# Patient Record
Sex: Male | Born: 1955 | Race: Black or African American | Hispanic: No | Marital: Married | Smoking: Former smoker
Health system: Southern US, Community
[De-identification: ages and names within clinical notes are randomized; demographics above are authoritative.]

## PROBLEM LIST (undated history)

## (undated) DIAGNOSIS — E119 Type 2 diabetes mellitus without complications: Secondary | ICD-10-CM

## (undated) DIAGNOSIS — I10 Essential (primary) hypertension: Secondary | ICD-10-CM

## (undated) DIAGNOSIS — M1990 Unspecified osteoarthritis, unspecified site: Secondary | ICD-10-CM

## (undated) DIAGNOSIS — E785 Hyperlipidemia, unspecified: Secondary | ICD-10-CM

## (undated) DIAGNOSIS — T8859XA Other complications of anesthesia, initial encounter: Secondary | ICD-10-CM

## (undated) DIAGNOSIS — T4145XA Adverse effect of unspecified anesthetic, initial encounter: Secondary | ICD-10-CM

## (undated) DIAGNOSIS — G629 Polyneuropathy, unspecified: Secondary | ICD-10-CM

## (undated) DIAGNOSIS — J45909 Unspecified asthma, uncomplicated: Secondary | ICD-10-CM

## (undated) DIAGNOSIS — M5126 Other intervertebral disc displacement, lumbar region: Secondary | ICD-10-CM

## (undated) DIAGNOSIS — M5020 Other cervical disc displacement, unspecified cervical region: Secondary | ICD-10-CM

## (undated) DIAGNOSIS — M359 Systemic involvement of connective tissue, unspecified: Secondary | ICD-10-CM

## (undated) HISTORY — PX: COLONOSCOPY: SHX174

## (undated) HISTORY — PX: EPIDURAL BLOCK INJECTION: SHX1516

## (undated) HISTORY — DX: Hyperlipidemia, unspecified: E78.5

## (undated) HISTORY — DX: Unspecified osteoarthritis, unspecified site: M19.90

## (undated) HISTORY — DX: Type 2 diabetes mellitus without complications: E11.9

## (undated) HISTORY — PX: CYST EXCISION: SHX5701

## (undated) HISTORY — DX: Polyneuropathy, unspecified: G62.9

---
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
## 2017-09-23 HISTORY — PX: COLONOSCOPY: SHX174

## 2018-05-06 ENCOUNTER — Encounter: Payer: Self-pay | Admitting: Radiology

## 2018-05-06 ENCOUNTER — Emergency Department: Payer: Federal, State, Local not specified - PPO

## 2018-05-06 ENCOUNTER — Emergency Department
Admission: EM | Admit: 2018-05-06 | Discharge: 2018-05-06 | Disposition: A | Discharge status: Home / Self Care | Payer: Federal, State, Local not specified - PPO | Attending: Emergency Medicine | Admitting: Emergency Medicine

## 2018-05-06 ENCOUNTER — Other Ambulatory Visit: Payer: Self-pay

## 2018-05-06 DIAGNOSIS — I10 Essential (primary) hypertension: Secondary | ICD-10-CM | POA: Insufficient documentation

## 2018-05-06 DIAGNOSIS — J45909 Unspecified asthma, uncomplicated: Secondary | ICD-10-CM | POA: Insufficient documentation

## 2018-05-06 DIAGNOSIS — K1120 Sialoadenitis, unspecified: Secondary | ICD-10-CM | POA: Insufficient documentation

## 2018-05-06 DIAGNOSIS — R600 Localized edema: Secondary | ICD-10-CM | POA: Diagnosis present

## 2018-05-06 HISTORY — DX: Systemic involvement of connective tissue, unspecified: M35.9

## 2018-05-06 HISTORY — DX: Unspecified asthma, uncomplicated: J45.909

## 2018-05-06 HISTORY — DX: Essential (primary) hypertension: I10

## 2018-05-06 LAB — CBC WITH DIFFERENTIAL/PLATELET
Basophils Absolute: 0 10*3/uL (ref 0–0.1)
Basophils Relative: 1 %
EOS ABS: 0.2 10*3/uL (ref 0–0.7)
EOS PCT: 2 %
HCT: 38.4 % — ABNORMAL LOW (ref 40.0–52.0)
Hemoglobin: 13.3 g/dL (ref 13.0–18.0)
LYMPHS ABS: 1.5 10*3/uL (ref 1.0–3.6)
Lymphocytes Relative: 25 %
MCH: 32 pg (ref 26.0–34.0)
MCHC: 34.5 g/dL (ref 32.0–36.0)
MCV: 92.5 fL (ref 80.0–100.0)
MONOS PCT: 8 %
Monocytes Absolute: 0.5 10*3/uL (ref 0.2–1.0)
Neutro Abs: 4.1 10*3/uL (ref 1.4–6.5)
Neutrophils Relative %: 64 %
PLATELETS: 220 10*3/uL (ref 150–440)
RBC: 4.15 MIL/uL — ABNORMAL LOW (ref 4.40–5.90)
RDW: 13.9 % (ref 11.5–14.5)
WBC: 6.3 10*3/uL (ref 3.8–10.6)

## 2018-05-06 LAB — BASIC METABOLIC PANEL
Anion gap: 9 (ref 5–15)
BUN: 13 mg/dL (ref 8–23)
CALCIUM: 9.1 mg/dL (ref 8.9–10.3)
CHLORIDE: 109 mmol/L (ref 98–111)
CO2: 25 mmol/L (ref 22–32)
CREATININE: 1.08 mg/dL (ref 0.61–1.24)
GFR calc Af Amer: >60 mL/min (ref >60)
GFR calc non Af Amer: >60 mL/min (ref >60)
Glucose, Bld: 124 mg/dL — ABNORMAL HIGH (ref 70–99)
Potassium: 3.7 mmol/L (ref 3.5–5.1)
SODIUM: 143 mmol/L (ref 135–145)

## 2018-05-06 MED ORDER — DEXAMETHASONE 4 MG PO TABS
12.0000 mg | ORAL_TABLET | Freq: Once | ORAL | Status: AC
  Administered 2018-05-06: 12 mg via ORAL
  Filled 2018-05-06 (×2): qty 3

## 2018-05-06 MED ORDER — IBUPROFEN 600 MG PO TABS: 600.0000 mg | ORAL_TABLET | Freq: Four times a day (QID) | ORAL | 0 refills | Status: DC | PRN

## 2018-05-06 MED ORDER — AMOXICILLIN-POT CLAVULANATE 875-125 MG PO TABS: 1.0000 | ORAL_TABLET | Freq: Two times a day (BID) | ORAL | 0 refills | Status: AC

## 2018-05-06 MED ORDER — AMOXICILLIN-POT CLAVULANATE 875-125 MG PO TABS
1.0000 | ORAL_TABLET | Freq: Once | ORAL | Status: AC
  Administered 2018-05-06: 1 via ORAL
  Filled 2018-05-06: qty 1

## 2018-05-06 MED ORDER — IOHEXOL 300 MG/ML  SOLN
75.0000 mL | Freq: Once | INTRAMUSCULAR | Status: AC | PRN
  Administered 2018-05-06: 75 mL via INTRAVENOUS

## 2018-05-06 NOTE — ED Triage Notes (Signed)
Pt in with co left sided neck pain and swelling radiates to left jaw and face. States has hx of salivary glad stones and states feels the same.

## 2018-05-06 NOTE — Discharge Instructions (Addendum)
Take the antibiotic as prescribed and finish the full course.  You may take ibuprofen as needed for pain.  You should take sour candies frequently throughout the day to help increase your production of saliva.  Make an appointment to follow-up with the ENT as soon as possible.  Return to the ER for new, worsening, persistent severe pain, swelling, fever, difficulty swallowing, shortness of breath, or any other new or worsening symptoms that concern you.

## 2018-05-06 NOTE — ED Provider Notes (Signed)
[facility] Emergency Department Provider Note ____________________________________________   First MD Initiated Contact with Patient 05/06/18 0209     (approximate)  I have reviewed the triage vital signs and the nursing notes.   HISTORY  Chief Complaint Facial Swelling    HPI Alejandro Mendoza is a 62 y.o. male with prior history of sialolithiasis who presents with left neck swelling over the last several days, gradual onset, persistent course, and associated with discomfort when swallowing.  Patient denies shortness of breath.  He states that it feels somewhat similar to when he has had salivary gland stones before, but he reports that those usually cause more discomfort in his tongue.  He denies fever or chills, and has no headache.  Past Medical History:  Diagnosis Date  . Asthma   . Collagen vascular disease (HCC)   . Hypertension     There are no active problems to display for this patient.     Prior to Admission medications   Medication Sig Start Date End Date Taking? Authorizing Provider  amoxicillin-clavulanate (AUGMENTIN) 875-125 MG tablet Take 1 tablet by mouth 2 (two) times daily for 7 days. 05/06/18 05/13/18  Dionne BucySiadecki, Azie Mcconahy, MD  ibuprofen (ADVIL,MOTRIN) 600 MG tablet Take 1 tablet (600 mg total) by mouth every 6 (six) hours as needed. 05/06/18   Dionne BucySiadecki, Manar Smalling, MD    Allergies Lisinopril  No family history on file.  Social History Social History   Tobacco Use  . Smoking status: Not on file  Substance Use Topics  . Alcohol use: Not on file  . Drug use: Not on file    Review of Systems  Constitutional: No fever/chills. Eyes: No visual changes. ENT: Positive for neck swelling. Cardiovascular: Denies chest pain. Respiratory: Denies shortness of breath. Gastrointestinal: No vomiting.  Genitourinary: Negative for dysuria.  Musculoskeletal: Negative for back pain. Skin: Negative for rash. Neurological: Negative for  headache.   ____________________________________________   PHYSICAL EXAM:  VITAL SIGNS: ED Triage Vitals  Enc Vitals Group     BP 05/06/18 0147 133/61     Pulse Rate 05/06/18 0147 67     Resp 05/06/18 0147 20     Temp 05/06/18 0147 98.6 F (37 C)     Temp Source 05/06/18 0147 Oral     SpO2 05/06/18 0147 99 %     Weight 05/06/18 0148 265 lb (120.2 kg)     Height 05/06/18 0148 6\' 3"  (1.905 m)     Head Circumference --      Peak Flow --      Pain Score 05/06/18 0148 1     Pain Loc --      Pain Edu? --      Excl. in GC? --     Constitutional: Alert and oriented. Well appearing and in no acute distress. Eyes: Conjunctivae are normal.  Head: Atraumatic. Nose: No congestion/rhinnorhea. Mouth/Throat: Mucous membranes are moist.  Oropharynx clear.  No stridor or pooled secretions. Neck: Left lateral neck and submandibular swelling.  No erythema, induration, or fluctuance. Cardiovascular: Normal rate, regular rhythm. Grossly normal heart sounds.  Good peripheral circulation. Respiratory: Normal respiratory effort.  No retractions. Lungs CTAB. Gastrointestinal: No distention.  Musculoskeletal:  Extremities warm and well perfused.  Neurologic:  Normal speech and language. No gross focal neurologic deficits are appreciated.  Skin:  Skin is warm and dry. No rash noted. Psychiatric: Mood and affect are normal. Speech and behavior are normal.  ____________________________________________   LABS (all labs ordered are listed,  but only abnormal results are displayed)  Labs Reviewed  BASIC METABOLIC PANEL - Abnormal; Notable for the following components:      Result Value   Glucose, Bld 124 (*)    All other components within normal limits  CBC WITH DIFFERENTIAL/PLATELET - Abnormal; Notable for the following components:   RBC 4.15 (*)    HCT 38.4 (*)    All other components within normal limits    ____________________________________________  EKG   ____________________________________________  RADIOLOGY  CT neck soft tissue: Acute left submandibular sialoadenitis ____________________________________________   PROCEDURES  Procedure(s) performed: No  Procedures  Critical Care performed: No ____________________________________________   INITIAL IMPRESSION / ASSESSMENT AND PLAN / ED COURSE  Pertinent labs & imaging results that were available during my care of the patient were reviewed by me and considered in my medical decision making (see chart for details).  62 year old male with prior history of sialolithiasis presents with left neck swelling and pain over the last several days, with no fever.  On exam, the patient is relatively well-appearing, the vital signs are normal, and his oropharynx is clear.  There is no evidence of airway involvement.  There is swelling to the left side of the neck and in the submandibular region.  No erythema or cutaneous findings.  Differential includes sialolithiasis, sialoadenitis, lymphadenopathy, cyst, or facial cellulitis.  We will obtain basic labs, CT neck soft tissue, and reassess.  ----------------------------------------- 5:33 AM on 05/06/2018 -----------------------------------------  CT confirms acute left submandibular sialoadenitis with no abscess or other acute complications.  The lab work-up is reassuring.  The patient is stable for discharge home.  We will give a dose of Decadron here as well as the first dose of Augmentin.  The patient will be prescribed Augmentin and ibuprofen for home.  I also provided an ENT referral.  The patient agrees with the discharge plan.  Return precautions given, and he expresses understanding.  ____________________________________________   FINAL CLINICAL IMPRESSION(S) / ED DIAGNOSES  Final diagnoses:  Sialadenitis      NEW MEDICATIONS STARTED DURING THIS VISIT:  New  Prescriptions   AMOXICILLIN-CLAVULANATE (AUGMENTIN) 875-125 MG TABLET    Take 1 tablet by mouth 2 (two) times daily for 7 days.   IBUPROFEN (ADVIL,MOTRIN) 600 MG TABLET    Take 1 tablet (600 mg total) by mouth every 6 (six) hours as needed.     Note:  This document was prepared using Dragon voice recognition software and may include unintentional dictation errors.    Dionne BucySiadecki, Kristene Liberati, MD 05/06/18 [phone]

## 2018-06-03 ENCOUNTER — Other Ambulatory Visit: Payer: Self-pay

## 2018-06-03 ENCOUNTER — Encounter
Admission: RE | Admit: 2018-06-03 | Discharge: 2018-06-03 | Disposition: A | Discharge status: Home / Self Care | Payer: Federal, State, Local not specified - PPO | Source: Ambulatory Visit | Attending: Otolaryngology | Admitting: Otolaryngology

## 2018-06-03 HISTORY — DX: Other complications of anesthesia, initial encounter: T88.59XA

## 2018-06-03 HISTORY — DX: Other cervical disc displacement, unspecified cervical region: M50.20

## 2018-06-03 HISTORY — DX: Other intervertebral disc displacement, lumbar region: M51.26

## 2018-06-03 HISTORY — DX: Unspecified osteoarthritis, unspecified site: M19.90

## 2018-06-03 HISTORY — DX: Adverse effect of unspecified anesthetic, initial encounter: T41.45XA

## 2018-06-03 NOTE — Patient Instructions (Signed)
Your procedure is scheduled on: 06-10-18 [facility] Report to Same Day Surgery 2nd floor medical mall [facility] Entrance-take elevator on left to 2nd floor.  Check in with surgery information desk.) To find out your arrival time please call [phone] between 1PM - 3PM on 06-09-18 TUESDAY  Remember: Instructions that are not followed completely may result in serious medical risk, up to and including death, or upon the discretion of your surgeon and anesthesiologist your surgery may need to be rescheduled.    _x___ 1. Do not eat food after midnight the night before your procedure. NO GUM OR CANDY AFTER MIDNIGHT.  You may drink clear liquids up to 2 hours before you are scheduled to arrive at the hospital for your procedure.  Do not drink clear liquids within 2 hours of your scheduled arrival to the hospital.  Clear liquids include  --Water or Apple juice without pulp  --Clear carbohydrate beverage such as ClearFast or Gatorade  --Black Coffee or Clear Tea (No milk, no creamers, do not add anything to the coffee or Tea   ____Ensure clear carbohydrate drink on the way to the hospital for bariatric patients  ____Ensure clear carbohydrate drink 3 hours before surgery for Dr Rutherford Nail patients if physician instructed.     __x__ 2. No Alcohol for 24 hours before or after surgery.   __x__3. No Smoking or e-cigarettes for 24 prior to surgery.  Do not use any chewable tobacco products for at least 6 hour prior to surgery   ____  4. Bring all medications with you on the day of surgery if instructed.    __x__ 5. Notify your doctor if there is any change in your medical condition     (cold, fever, infections).    x___6. On the morning of surgery brush your teeth with toothpaste and water.  You may rinse your mouth with mouth wash if you wish.  Do not swallow any toothpaste or mouthwash.   Do not wear jewelry, make-up, hairpins, clips or nail polish.  Do not wear lotions, powders, or perfumes.  You may wear deodorant.  Do not shave 48 hours prior to surgery. Men may shave face and neck.  Do not bring valuables to the hospital.    [facility] is not responsible for any belongings or valuables.               Contacts, dentures or bridgework may not be worn into surgery.  Leave your suitcase in the car. After surgery it may be brought to your room.  For patients admitted to the hospital, discharge time is determined by your treatment team.  _  Patients discharged the day of surgery will not be allowed to drive home.  You will need someone to drive you home and stay with you the night of your procedure.    Please read over the following fact sheets that you were given:   [facility] Preparing for Surgery    _x___ TAKE THE FOLLOWING MEDICATION THE MORNING OF SURGERY WITH SMALL SIP OF WATER. These include:  1. LIPITOR  2. DILTIAZEM  3. GABAPENTIN  4.  5.  6.  ____Fleets enema or Magnesium Citrate as directed.   _x___ Use CHG Soap or sage wipes as directed on instruction sheet   _X___ Use inhalers on the day of surgery and bring to hospital day of surgery-BRING ALBUTEROL INHALER TO HOSPITAL DAY OF SURGERY  ____ Stop Metformin and Janumet 2 days prior to surgery.    ____  Take 1/2 of usual insulin dose the night before surgery and none on the morning surgery.   _x___ Follow recommendations from Cardiologist, Pulmonologist or PCP regarding stopping Aspirin, Coumadin, Plavix ,Eliquis, Effient, or Pradaxa, and Pletal-PT STOPPED ASPIRIN ON 05-31-18  X____Stop Anti-inflammatories such as Advil, Aleve, Ibuprofen, Motrin, Naproxen, Naprosyn, Goodies powders or aspirin products NOW- OK to take Tylenol OR HYDROCODONE IF NEEDED   _x___ Stop supplements until after surgery-PT STOPPED FISH OIL ON 05-31-18   ____ Bring C-Pap to the hospital.

## 2018-06-04 ENCOUNTER — Encounter
Admission: RE | Admit: 2018-06-04 | Discharge: 2018-06-04 | Disposition: A | Discharge status: Home / Self Care | Payer: Federal, State, Local not specified - PPO | Source: Ambulatory Visit | Attending: Otolaryngology | Admitting: Otolaryngology

## 2018-06-04 DIAGNOSIS — R001 Bradycardia, unspecified: Secondary | ICD-10-CM | POA: Insufficient documentation

## 2018-06-04 DIAGNOSIS — I10 Essential (primary) hypertension: Secondary | ICD-10-CM | POA: Insufficient documentation

## 2018-06-10 ENCOUNTER — Encounter: Payer: Self-pay | Admitting: *Deleted

## 2018-06-10 ENCOUNTER — Ambulatory Visit: Payer: Federal, State, Local not specified - PPO | Admitting: Anesthesiology

## 2018-06-10 ENCOUNTER — Ambulatory Visit
Admission: RE | Admit: 2018-06-10 | Discharge: 2018-06-10 | Disposition: A | Discharge status: Home / Self Care | Payer: Federal, State, Local not specified - PPO | Source: Ambulatory Visit | Attending: Otolaryngology | Admitting: Otolaryngology

## 2018-06-10 ENCOUNTER — Encounter
Admission: RE | Disposition: A | Discharge status: Home / Self Care | Payer: Self-pay | Source: Ambulatory Visit | Attending: Otolaryngology

## 2018-06-10 ENCOUNTER — Other Ambulatory Visit: Payer: Self-pay

## 2018-06-10 DIAGNOSIS — Z87891 Personal history of nicotine dependence: Secondary | ICD-10-CM | POA: Diagnosis not present

## 2018-06-10 DIAGNOSIS — I10 Essential (primary) hypertension: Secondary | ICD-10-CM | POA: Insufficient documentation

## 2018-06-10 DIAGNOSIS — Z7951 Long term (current) use of inhaled steroids: Secondary | ICD-10-CM | POA: Insufficient documentation

## 2018-06-10 DIAGNOSIS — K115 Sialolithiasis: Secondary | ICD-10-CM | POA: Insufficient documentation

## 2018-06-10 DIAGNOSIS — K219 Gastro-esophageal reflux disease without esophagitis: Secondary | ICD-10-CM | POA: Insufficient documentation

## 2018-06-10 DIAGNOSIS — K1123 Chronic sialoadenitis: Secondary | ICD-10-CM | POA: Diagnosis not present

## 2018-06-10 DIAGNOSIS — Z79899 Other long term (current) drug therapy: Secondary | ICD-10-CM | POA: Diagnosis not present

## 2018-06-10 DIAGNOSIS — K110 Atrophy of salivary gland: Secondary | ICD-10-CM | POA: Insufficient documentation

## 2018-06-10 DIAGNOSIS — J45909 Unspecified asthma, uncomplicated: Secondary | ICD-10-CM | POA: Diagnosis not present

## 2018-06-10 HISTORY — PX: CONTINUOUS NERVE MONITORING: SHX6650

## 2018-06-10 HISTORY — PX: SALIVARY STONE REMOVAL: SHX5213

## 2018-06-10 HISTORY — PX: SUBMANDIBULAR GLAND EXCISION: SHX2456

## 2018-06-10 SURGERY — EXCISION, SUBMANDIBULAR GLAND
Anesthesia: General | Site: Mouth

## 2018-06-10 MED ORDER — HYDROCODONE-ACETAMINOPHEN 5-325 MG PO TABS: 1.0000 | ORAL_TABLET | ORAL | 0 refills | Status: DC | PRN

## 2018-06-10 MED ORDER — BACITRACIN ZINC 500 UNIT/GM EX OINT
TOPICAL_OINTMENT | CUTANEOUS | Status: AC
  Filled 2018-06-10: qty 28.35

## 2018-06-10 MED ORDER — FENTANYL CITRATE (PF) 100 MCG/2ML IJ SOLN
INTRAMUSCULAR | Status: AC
  Filled 2018-06-10: qty 2

## 2018-06-10 MED ORDER — ONDANSETRON HCL 4 MG/2ML IJ SOLN
INTRAMUSCULAR | Status: DC | PRN
  Administered 2018-06-10: 4 mg via INTRAVENOUS

## 2018-06-10 MED ORDER — DEXAMETHASONE SODIUM PHOSPHATE 10 MG/ML IJ SOLN
INTRAMUSCULAR | Status: DC | PRN
  Administered 2018-06-10: 6 mg via INTRAVENOUS

## 2018-06-10 MED ORDER — ROCURONIUM 10MG/ML (10ML) SYRINGE FOR MEDFUSION PUMP - OPTIME
INTRAVENOUS | Status: DC | PRN
  Administered 2018-06-10: 20 mg via INTRAVENOUS

## 2018-06-10 MED ORDER — LACTATED RINGERS IV SOLN
INTRAVENOUS | Status: DC
  Administered 2018-06-10: 07:00:00 via INTRAVENOUS

## 2018-06-10 MED ORDER — SODIUM CHLORIDE 0.9 % IV SOLN
INTRAVENOUS | Status: DC | PRN
  Administered 2018-06-10: .1 ug/kg/min via INTRAVENOUS

## 2018-06-10 MED ORDER — LIDOCAINE-EPINEPHRINE (PF) 1 %-1:200000 IJ SOLN
INTRAMUSCULAR | Status: DC | PRN
  Administered 2018-06-10: 6 mL

## 2018-06-10 MED ORDER — PROPOFOL 10 MG/ML IV BOLUS
INTRAVENOUS | Status: DC | PRN
  Administered 2018-06-10: 160 mg via INTRAVENOUS
  Administered 2018-06-10: 40 mg via INTRAVENOUS

## 2018-06-10 MED ORDER — SUCCINYLCHOLINE CHLORIDE 20 MG/ML IJ SOLN
INTRAMUSCULAR | Status: DC | PRN
  Administered 2018-06-10: 140 mg via INTRAVENOUS

## 2018-06-10 MED ORDER — OXYCODONE HCL 5 MG/5ML PO SOLN: 5.0000 mg | Freq: Once | ORAL | Status: DC | PRN

## 2018-06-10 MED ORDER — EPHEDRINE SULFATE 50 MG/ML IJ SOLN
INTRAMUSCULAR | Status: DC | PRN
  Administered 2018-06-10 (×3): 10 mg via INTRAVENOUS

## 2018-06-10 MED ORDER — PHENYLEPHRINE HCL 10 MG/ML IJ SOLN
INTRAMUSCULAR | Status: DC | PRN
  Administered 2018-06-10: 200 ug via INTRAVENOUS

## 2018-06-10 MED ORDER — REMIFENTANIL HCL 1 MG IV SOLR
INTRAVENOUS | Status: AC
  Filled 2018-06-10: qty 1000

## 2018-06-10 MED ORDER — LIDOCAINE HCL (CARDIAC) PF 100 MG/5ML IV SOSY
PREFILLED_SYRINGE | INTRAVENOUS | Status: DC | PRN
  Administered 2018-06-10: 100 mg via INTRAVENOUS

## 2018-06-10 MED ORDER — PROPOFOL 10 MG/ML IV BOLUS
INTRAVENOUS | Status: AC
  Filled 2018-06-10: qty 20

## 2018-06-10 MED ORDER — AMOXICILLIN-POT CLAVULANATE 875-125 MG PO TABS: 1.0000 | ORAL_TABLET | Freq: Two times a day (BID) | ORAL | 0 refills | Status: DC

## 2018-06-10 MED ORDER — SUGAMMADEX SODIUM 200 MG/2ML IV SOLN
INTRAVENOUS | Status: DC | PRN
  Administered 2018-06-10: 200 mg via INTRAVENOUS

## 2018-06-10 MED ORDER — FAMOTIDINE 20 MG PO TABS
ORAL_TABLET | ORAL | Status: AC
  Administered 2018-06-10: 20 mg via ORAL
  Filled 2018-06-10: qty 1

## 2018-06-10 MED ORDER — MIDAZOLAM HCL 2 MG/2ML IJ SOLN
INTRAMUSCULAR | Status: AC
  Filled 2018-06-10: qty 2

## 2018-06-10 MED ORDER — LIDOCAINE-EPINEPHRINE (PF) 1 %-1:200000 IJ SOLN
INTRAMUSCULAR | Status: AC
  Filled 2018-06-10: qty 30

## 2018-06-10 MED ORDER — MIDAZOLAM HCL 2 MG/2ML IJ SOLN
INTRAMUSCULAR | Status: DC | PRN
  Administered 2018-06-10: 2 mg via INTRAVENOUS

## 2018-06-10 MED ORDER — FENTANYL CITRATE (PF) 100 MCG/2ML IJ SOLN
25.0000 ug | INTRAMUSCULAR | Status: DC | PRN
  Administered 2018-06-10 (×3): 25 ug via INTRAVENOUS

## 2018-06-10 MED ORDER — FENTANYL CITRATE (PF) 100 MCG/2ML IJ SOLN
INTRAMUSCULAR | Status: DC | PRN
  Administered 2018-06-10 (×2): 50 ug via INTRAVENOUS

## 2018-06-10 MED ORDER — VASOPRESSIN 20 UNIT/ML IV SOLN
INTRAVENOUS | Status: DC | PRN
  Administered 2018-06-10 (×2): 1 [IU] via INTRAVENOUS

## 2018-06-10 MED ORDER — SODIUM CHLORIDE 0.9 % IV SOLN
INTRAVENOUS | Status: DC | PRN
  Administered 2018-06-10: 25 ug/min via INTRAVENOUS

## 2018-06-10 MED ORDER — OXYCODONE HCL 5 MG PO TABS: 5.0000 mg | ORAL_TABLET | Freq: Once | ORAL | Status: DC | PRN

## 2018-06-10 MED ORDER — FAMOTIDINE 20 MG PO TABS
20.0000 mg | ORAL_TABLET | Freq: Once | ORAL | Status: AC
  Administered 2018-06-10: 20 mg via ORAL

## 2018-06-10 SURGICAL SUPPLY — 31 items
BLADE SURG 15 STRL LF DISP TIS (BLADE) ×3 IMPLANT
BLADE SURG 15 STRL SS (BLADE) ×2
CANISTER SUCT 1200ML W/VALVE (MISCELLANEOUS) ×5 IMPLANT
CORD BIP STRL DISP 12FT (MISCELLANEOUS) ×5 IMPLANT
DRAIN TLS ROUND 10FR (DRAIN) ×5 IMPLANT
DRAPE MAG INST 16X20 L/F (DRAPES) ×5 IMPLANT
DRSG TEGADERM 2-3/8X2-3/4 SM (GAUZE/BANDAGES/DRESSINGS) ×5 IMPLANT
ELECT EMG 20MM DUAL (MISCELLANEOUS) ×5
ELECT NEEDLE 20X.3 GREEN (MISCELLANEOUS) ×5
ELECT REM PT RETURN 9FT ADLT (ELECTROSURGICAL) ×5
ELECTRODE EMG 20MM DUAL (MISCELLANEOUS) ×3 IMPLANT
ELECTRODE NEEDLE 20X.3 GREEN (MISCELLANEOUS) ×3 IMPLANT
ELECTRODE REM PT RTRN 9FT ADLT (ELECTROSURGICAL) ×3 IMPLANT
FORCEPS JEWEL BIP 4-3/4 STR (INSTRUMENTS) ×5 IMPLANT
GLOVE BIO SURGEON STRL SZ7.5 (GLOVE) ×15 IMPLANT
GOWN STRL REUS W/ TWL LRG LVL3 (GOWN DISPOSABLE) ×9 IMPLANT
GOWN STRL REUS W/TWL LRG LVL3 (GOWN DISPOSABLE) ×6
HOOK STAY BLUNT/RETRACTOR 5M (MISCELLANEOUS) ×5 IMPLANT
KIT TURNOVER KIT A (KITS) ×5 IMPLANT
LABEL OR SOLS (LABEL) ×5 IMPLANT
NS IRRIG 500ML POUR BTL (IV SOLUTION) ×5 IMPLANT
PACK HEAD/NECK (MISCELLANEOUS) ×5 IMPLANT
PROBE MONO 100X0.75 ELECT 1.9M (MISCELLANEOUS) ×5 IMPLANT
SHEARS HARMONIC 9CM CVD (BLADE) ×5 IMPLANT
SPONGE KITTNER 5P (MISCELLANEOUS) ×5 IMPLANT
SPONGE XRAY 4X4 16PLY STRL (MISCELLANEOUS) ×5 IMPLANT
SUT PROLENE 5 0 PS 3 (SUTURE) ×5 IMPLANT
SUT SILK 2 0 (SUTURE) ×2
SUT SILK 2 0 PERMA HAND 18 BK (SUTURE) ×5 IMPLANT
SUT SILK 2-0 18XBRD TIE 12 (SUTURE) ×3 IMPLANT
SUT VIC AB 4-0 RB1 18 (SUTURE) ×5 IMPLANT

## 2018-06-10 NOTE — Transfer of Care (Signed)
Immediate Anesthesia Transfer of Care Note  Patient: Alejandro Mendoza  Procedure(s) Performed: EXCISION SUBMANDIBULAR GLAND (Left ) FACIAL NERVE MONITORING (N/A )  Patient Location: PACU  Anesthesia Type:General  Level of Consciousness: sedated  Airway & Oxygen Therapy: Patient Spontanous Breathing and Patient connected to face mask oxygen  Post-op Assessment: Report given to RN and Post -op Vital signs reviewed and stable  Post vital signs: Reviewed and stable  Last Vitals:  Vitals Value Taken Time  BP 116/66 06/10/2018 10:04 AM  Temp    Pulse 57 06/10/2018 10:06 AM  Resp    SpO2 98 % 06/10/2018 10:06 AM  Vitals shown include unvalidated device data.  Last Pain:  Vitals:   06/10/18 0618  TempSrc: Tympanic  PainSc: 4       Patients Stated Pain Goal: 0 (06/10/18 0618)  Complications: No apparent anesthesia complications

## 2018-06-10 NOTE — Anesthesia Procedure Notes (Signed)
Procedure Name: Intubation Date/Time: 06/10/2018 7:47 AM Performed by: Danelle BerryWarr, Karenna Romanoff E, CRNA Pre-anesthesia Checklist: Patient identified, Emergency Drugs available, Suction available, Patient being monitored and Timeout performed Patient Re-evaluated:Patient Re-evaluated prior to induction Oxygen Delivery Method: Simple face mask Preoxygenation: Pre-oxygenation with 100% oxygen Induction Type: IV induction Ventilation: Mask ventilation without difficulty Laryngoscope Size: McGraph and 4 Grade View: Grade II Tube size: 7.5 mm Number of attempts: 2 Airway Equipment and Method: Stylet Secured at: 22 cm Tube secured with: Tape Dental Injury: Teeth and Oropharynx as per pre-operative assessment

## 2018-06-10 NOTE — Op Note (Signed)
06/10/2018  9:39 AM    Alejandro Mendoza  [id number]   Pre-Op Diagnosis:  LEFT SUBMANDIBULAR SALIVARY GLAND STONES  Post-op Diagnosis: SAME  Procedure: EXCISION LEFT SUBMANDIBULAR GLAND WITH TRANSORAL REMOVAL OF SUBMANDIBULAR DUCT STONE  Surgeon:  Alejandro Mealy  Assistant: Linus Salmons, MD  Anesthesia:  General endotracheal anesthesia  EBL:  25cc  Complications:  None  Findings: Inflamed left submandibular gland with large stone in hilum of gland and dilated duct. Approximately 8mm stone in distal duct near the opening in the floor of the mouth was removed transorally  Procedure: The patient was taken to the Operating Room and placed in the supine position.  After induction of general endotracheal anesthesia, the patient was turned 90 degrees and placed on a shoulder roll. The skin was injected along the proposed incision line just inferior to the left submandibular gland 2 finger-breadths below the mandible.  1% lidocaine with epinephrine, 1:200,000. The facial nerve monitor electrodes were placed in the usual fashion at the  lower lip on the same side of the procedure. Proper functioning of the nerve monitor was assessed. The area was then prepped and draped in the usual sterile fashion.  A 15 blade was then used to incise the skin, and was carried down to the subcutaneous tissues with the Bovie, dividing the platysma muscle. Dissection then proceeded deeply, using the Harmonic scalpel to divid tissues, and monitoring carefully for any lip movement or nerve stimulation. The facial vein was divided and ties with 2-3 silk ligatures. The gland was then carefully dissected out, dividing soft tissue and fascial attachments to the capsule of the gland, which appeared inflamed. Branches from the facial artery into the gland were divided with the Harmonic Scalpel, preserving the artery. The lingual nerve was identified superiorly, and its ganglion branch divided to release it from the gland.  Dissection proceeded up under the mylohyoid muscle, following the gland and duct up towards the floor of the mouth.The more distal stone could not be located through the transcervical approach, though the proximal stone was palpable within the gland. The duct was divided and tied off with 2-0 silk, and the gland sent as specimen.   A drape was placed over the neck wound and the floor of mouth inspected, using a bite block to open the mouth. The distal stone was easily palpable and a 15 blade used to incise down onto the stone where it was close to the surface. Tenotomies were used to spread around the stone and gently work it out of the distal aspect of the duct, removing it. Instruments used for this were placed away from the sterile instrument and gloves were changed, and attention turned back to the neck.  The wound was irrigated with saline and hemostasis obtained. A #10 TLS drain was placed through a separate stab incision in the skin inferior to the incision in the neck, and secured with a 2-0 silk suture. Deep closure was performed with 4-0 Vicryl suture to close the platysma muscle. The subcutaneous tissues were then closed with 4-0 Vicryl suture in an interrupted fashion. The skin was closed with 5-0 Prolene suture in a running locked stitch. Bacitracin ointment was applied to the wound, and the vacuum tube applied to the TLS drain.  The patient was then returned to the anesthesiologist for awakening, and was taken to the Recovery Room in stable condition.  Cultures:  None.  Disposition:   PACU then discharge home  Plan: Leave drain in place until follow-up  tomorrow in office. Soft diet.   Alejandro Mendoza 06/10/2018 9:39 AM

## 2018-06-10 NOTE — OR Nursing (Signed)
Arrived to post op phase II c/o left jaw pain. States he feels something is in his throat and he feels he can't swallow. Visualized throat and found a bite block in the left cheek between the upper and lower teeth. Paged Dr Willeen CassBennett and instructed to remove. Upon removal, he had immediate relief. Mucous membrane intact. Anesthesia also notified.

## 2018-06-10 NOTE — Anesthesia Preprocedure Evaluation (Addendum)
Anesthesia Evaluation  Patient identified by MRN, date of birth, ID band Patient awake    Reviewed: Allergy & Precautions, H&P , NPO status , Patient's Chart, lab work & pertinent test results  History of Anesthesia Complications (+) history of anesthetic complications (heartburn after epidural)  Airway Mallampati: III  TM Distance: >3 FB Neck ROM: full   Comment: beard Dental no notable dental hx. (+) Teeth Intact   Pulmonary neg pulmonary ROS, asthma , former smoker,    breath sounds clear to auscultation       Cardiovascular hypertension, negative cardio ROS   Rhythm:regular Rate:Normal     Neuro/Psych negative neurological ROS  negative psych ROS   GI/Hepatic negative GI ROS, Neg liver ROS, GERD  Medicated and Controlled,  Endo/Other  negative endocrine ROS  Renal/GU      Musculoskeletal  (+) Arthritis ,   Abdominal   Peds  Hematology negative hematology ROS (+)   Anesthesia Other Findings Past Medical History: No date: Arthritis No date: Asthma     Comment:  ENVIRONMENTAL IN 2000-ALBUTEROL PRN No date: Collagen vascular disease (HCC) No date: Complication of anesthesia     Comment:  SEVERE HEARTBURN 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, 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 No date: Hypertension No date: Slipped disc in neck  Past Surgical History: No date: CYST EXCISION     Comment:  FROM INSIDE IN JAW No date: EPIDURAL BLOCK INJECTION  BMI    Body Mass Index:  33.07 kg/m      Reproductive/Obstetrics negative OB ROS                            Anesthesia Physical Anesthesia Plan  ASA: II  Anesthesia Plan: General ETT   Post-op Pain Management:    Induction:   PONV Risk Score and Plan: Ondansetron and Dexamethasone  Airway Management Planned:   Additional Equipment:   Intra-op Plan:   Post-operative Plan:   Informed Consent: I have reviewed the patients  History and Physical, chart, labs and discussed the procedure including the risks, benefits and alternatives for the proposed anesthesia with the patient or authorized representative who has indicated his/her understanding and acceptance.   Dental Advisory Given  Plan Discussed with: Anesthesiologist, CRNA and Surgeon  Anesthesia Plan Comments:         Anesthesia Quick Evaluation

## 2018-06-10 NOTE — H&P (Signed)
History and physical reviewed and will be scanned in later. No change in medical status reported by the patient or family, appears stable for surgery. All questions regarding the procedure answered, and patient (or family if a child) expressed understanding of the procedure. ? ?Alejandro Mendoza ?@TODAY@ ?

## 2018-06-10 NOTE — Anesthesia Post-op Follow-up Note (Signed)
Anesthesia QCDR form completed.        

## 2018-06-10 NOTE — Discharge Instructions (Signed)

## 2018-06-11 LAB — SURGICAL PATHOLOGY

## 2018-06-11 NOTE — Anesthesia Postprocedure Evaluation (Signed)
Anesthesia Post Note  Patient: Alejandro Mendoza  Procedure(s) Performed: EXCISION SUBMANDIBULAR GLAND (Left ) FACIAL NERVE MONITORING (N/A )  Patient location during evaluation: PACU Anesthesia Type: General Level of consciousness: awake and alert Pain management: pain level controlled Vital Signs Assessment: post-procedure vital signs reviewed and stable Respiratory status: spontaneous breathing, nonlabored ventilation and respiratory function stable Cardiovascular status: blood pressure returned to baseline and stable Postop Assessment: no apparent nausea or vomiting Anesthetic complications: no     Last Vitals:  Vitals:   06/10/18 1132 06/10/18 1229  BP: 127/78 (!) 144/72  Pulse: (!) 54 (!) 56  Resp: 16 16  Temp: (!) 36 C   SpO2: 97% 98%    Last Pain:  Vitals:   06/10/18 1229  TempSrc:   PainSc: 0-No pain                 Jovita GammaKathryn L Fitzgerald  Addendum: pt noted to have a black bite guard in his mouth that was causing him discomfort in Phase II.  This is an object that was inserted in the mouth by surgery team and anesthesia was not aware of its presence.  Incident report was filed.

## 2018-06-12 ENCOUNTER — Encounter: Payer: Self-pay | Admitting: Otolaryngology

## 2019-01-26 IMAGING — CT CT NECK W/ CM
4 of 5 series · 15 of 33 positions shown, 17 images · IV contrast (omnipaque)
Comparison: None.

CLINICAL DATA: LEFT neck pain and swelling radiating to face.
History of sialolith with similar symptoms.

EXAM:
CT NECK WITH CONTRAST
TECHNIQUE: Multidetector CT imaging of the neck was performed using the
standard protocol following the bolus administration of intravenous
contrast.
CONTRAST:  75mL OMNIPAQUE IOHEXOL 300 MG/ML  SOLN

[Series 2: axial neck · axial · 0.50mm/px · z∈[-263,-109]mm · 4 of 129 slices shown, 5 images]
[im 26/129  soft-tissue]
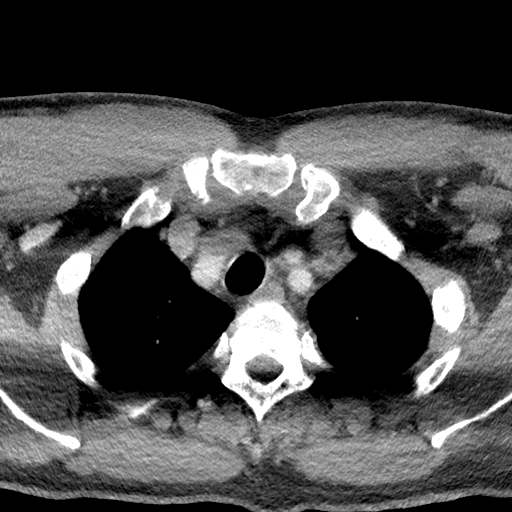
[im 26/129  bone]
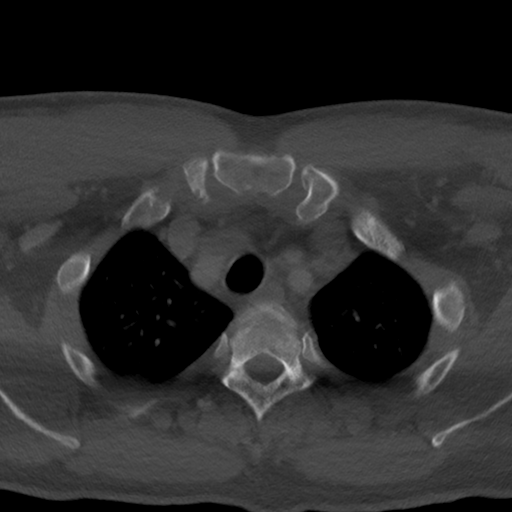
[im 52/129  bone]
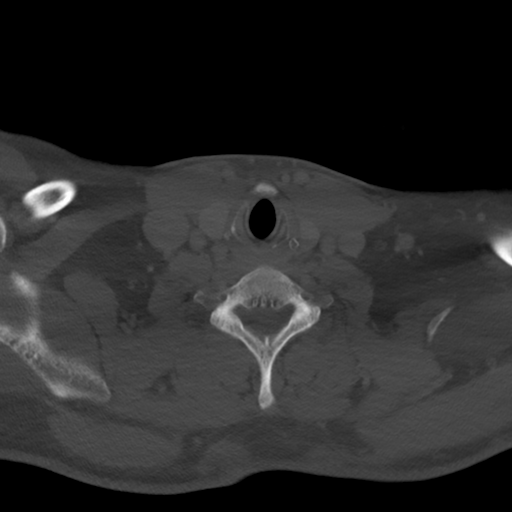
[im 77/129  bone]
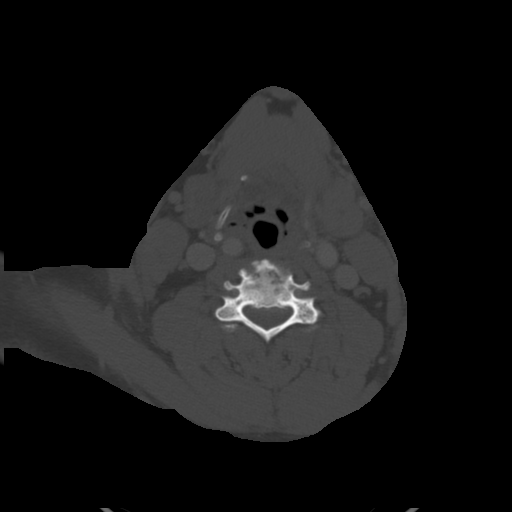
[im 103/129  bone]
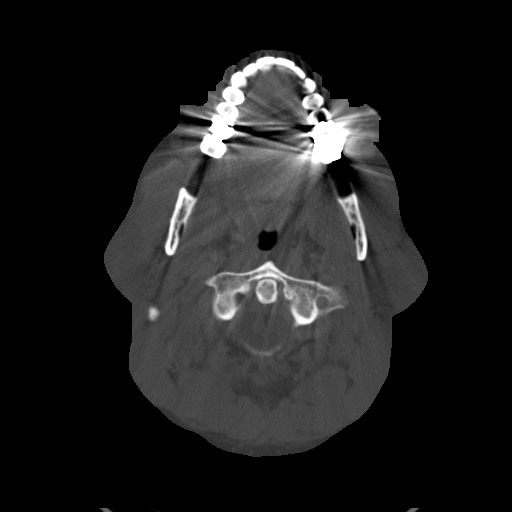

[Series 6: sag neck · sagittal · 0.51mm/px · 5 of 100 slices shown, 6 images]
[im 34/100  bone]
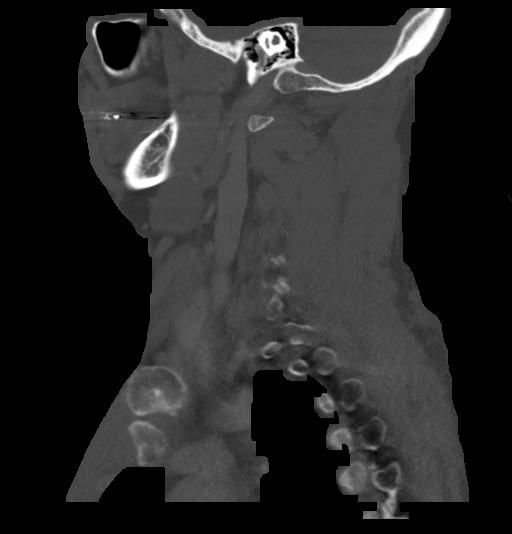
[im 42/100  bone]
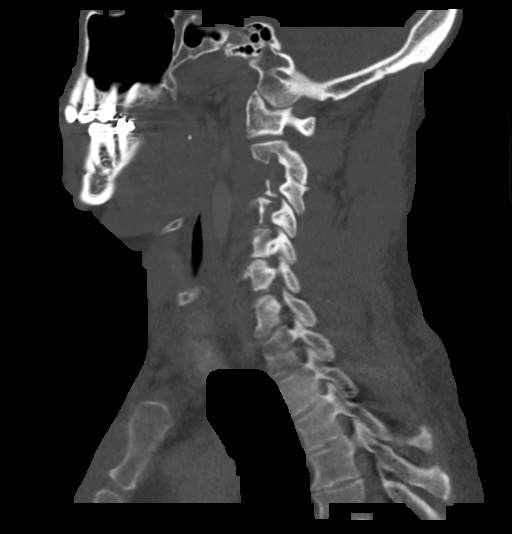
[im 50/100  soft-tissue]
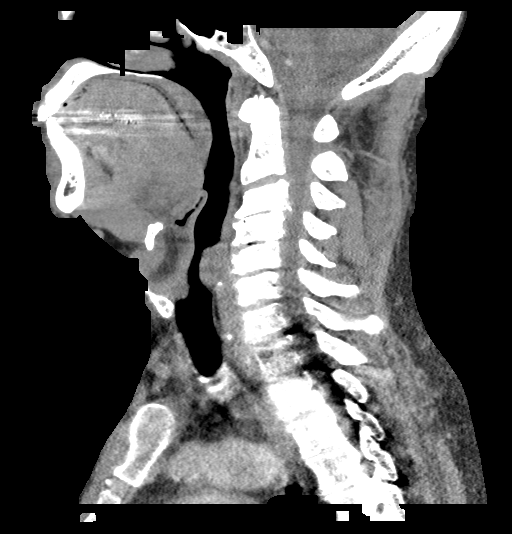
[im 50/100  bone]
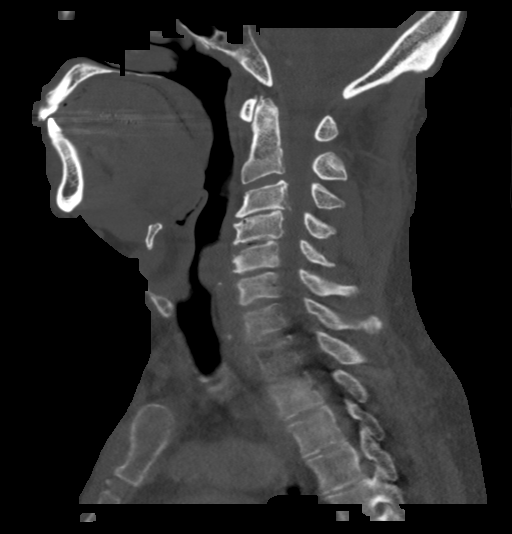
[im 58/100  bone]
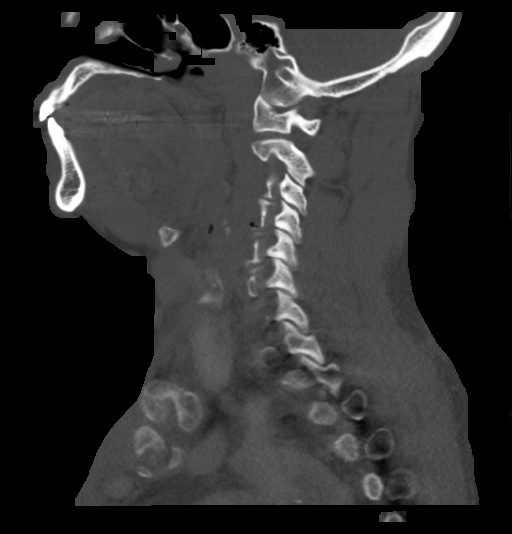
[im 67/100  bone]
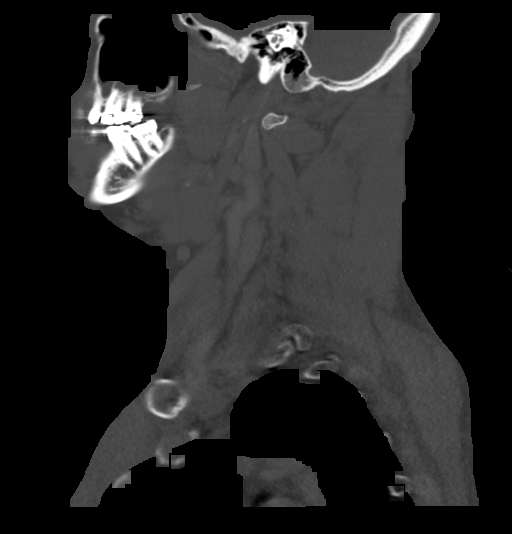

[Series 7: cor neck · coronal · 0.42mm/px · 3 of 117 slices shown]
[im 24/117  bone]
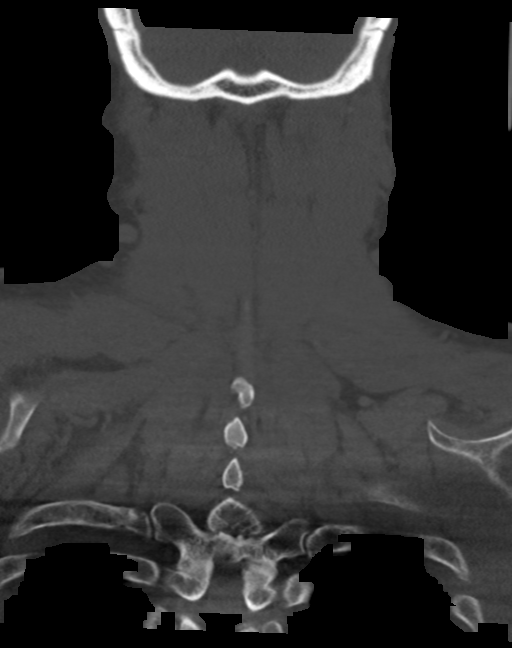
[im 47/117  bone]
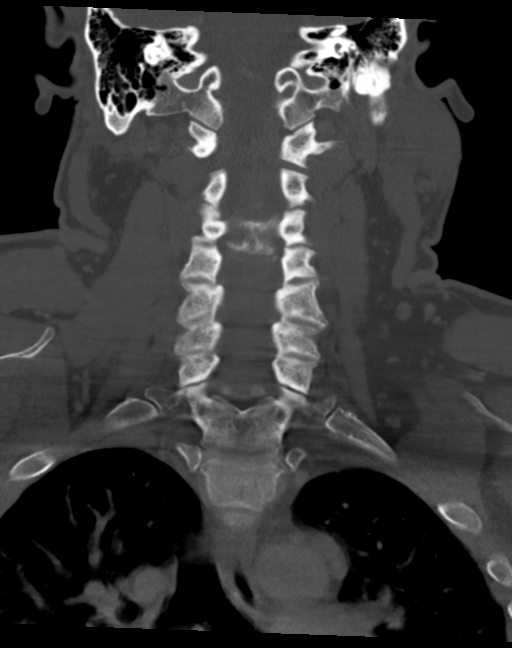
[im 70/117  bone]
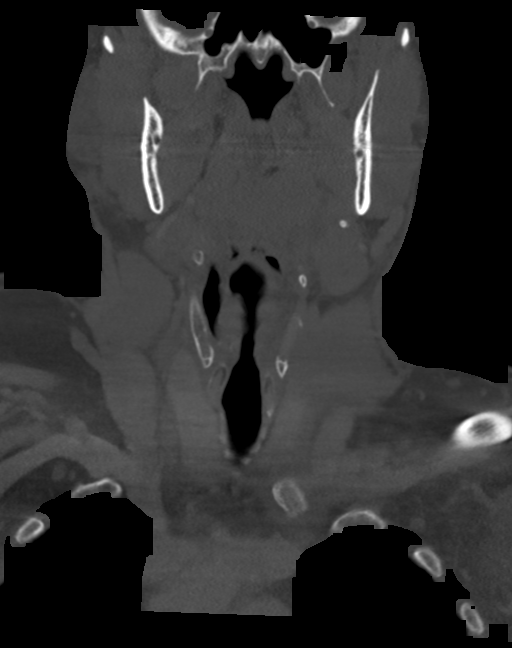

[Series 8: orthogonal ax · axial · 0.39mm/px · z∈[-265,-161]mm · 3 of 130 slices shown]
[im 26/130  bone]
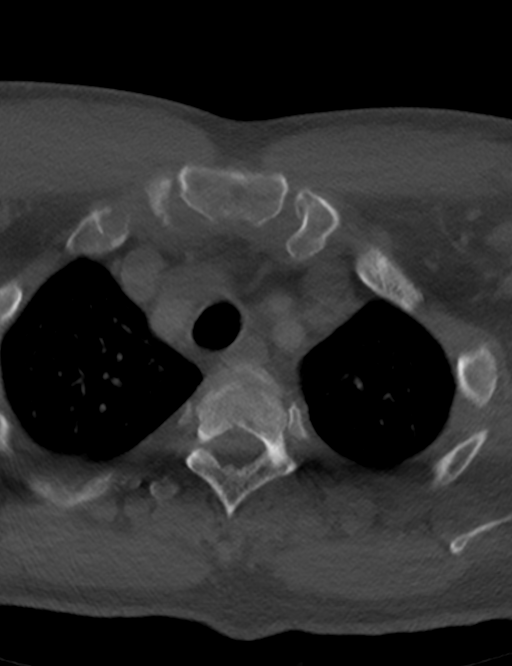
[im 52/130  bone]
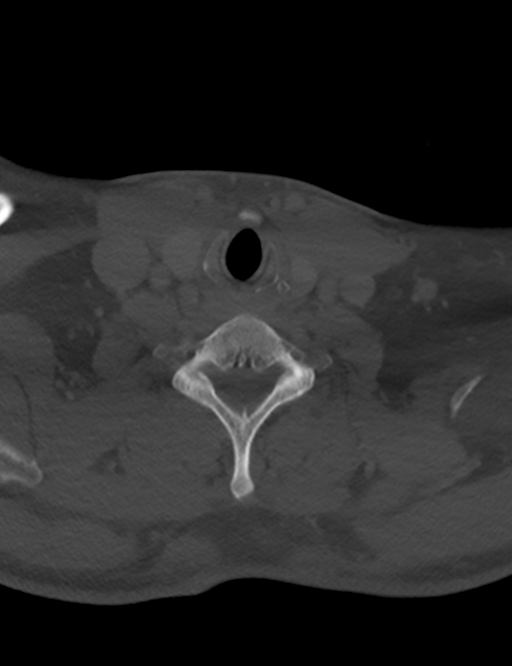
[im 78/130  bone]
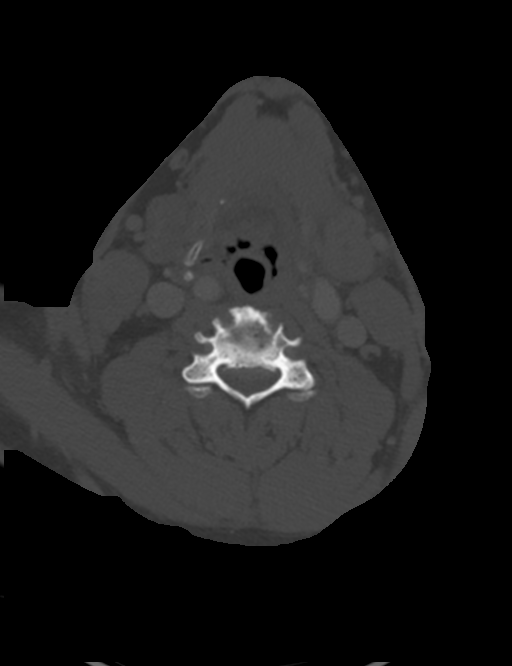

[15 of 33 positions shown; findings below may reference images not displayed]

FINDINGS: PHARYNX AND LARYNX: Normal.  Widely patent airway.

SALIVARY GLANDS: 7 mm LEFT submandibular sialolith. Mild
inflammatory changes LEFT submental tubular duct with mild ductal
dilatation, no mass. 5 mm round LEFT sublingual sialolith. Punctate
LEFT parotid sialoliths.

THYROID: Normal.

LYMPH NODES: No lymphadenopathy by CT size criteria.

VASCULAR: Mild atherosclerosis carotid bifurcations.

LIMITED INTRACRANIAL: Normal.

VISUALIZED ORBITS: Normal.

MASTOIDS AND VISUALIZED PARANASAL SINUSES: LEFT maxillary mucosal
retention cyst, associated LEFT maxillary periapical cyst.

SKELETON: Nonacute. Advanced degenerative changes cervical spine.
Severe canal stenosis C3-4, moderate C4-5. Severe C3-4, C4-5 neural
foraminal narrowing. Moderate to severe C5-6 neural foraminal
narrowing.

UPPER CHEST: Lung apices are clear. Prominent though not
pathologically enlarged superior mediastinal lymph nodes.

OTHER: Mildly thickened bilateral platysma. Mild LEFT neck effusion
without focal fluid collection, subcutaneous gas or radiopaque
foreign bodies.
IMPRESSION: 1. Acute LEFT submandibular sialoadenitis.
2. LEFT sublingual, LEFT submandibular and LEFT parotid sialoliths.
3. Severe canal stenosis C3-4. Severe C3-4 and C4-5 neural foraminal
narrowing.

## 2021-08-02 ENCOUNTER — Ambulatory Visit (INDEPENDENT_AMBULATORY_CARE_PROVIDER_SITE_OTHER): Payer: Medicare Other | Admitting: Dermatology

## 2021-08-02 ENCOUNTER — Other Ambulatory Visit: Payer: Self-pay

## 2021-08-02 DIAGNOSIS — L821 Other seborrheic keratosis: Secondary | ICD-10-CM | POA: Diagnosis not present

## 2021-08-02 DIAGNOSIS — L578 Other skin changes due to chronic exposure to nonionizing radiation: Secondary | ICD-10-CM

## 2021-08-02 DIAGNOSIS — L820 Inflamed seborrheic keratosis: Secondary | ICD-10-CM | POA: Diagnosis not present

## 2021-08-02 NOTE — Progress Notes (Signed)
   New Patient Visit  Subjective  Alejandro Mendoza is a 65 y.o. male who presents for the following: Other (Dark spot of scalp that has gotten bigger over the last year). He has other spots on his scalp he would like checked. Accompanied by wife who contributes to history  The following portions of the chart were reviewed this encounter and updated as appropriate:   Tobacco  Allergies  Meds  Problems  Med Hx  Surg Hx  Fam Hx     Review of Systems:  No other skin or systemic complaints except as noted in HPI or Assessment and Plan.  Objective  Well appearing patient in no apparent distress; mood and affect are within normal limits.  A focused examination was performed including scalp. Relevant physical exam findings are noted in the Assessment and Plan.  Scalp Stuck-on, waxy, tan-brown papule or plaque --Discussed benign etiology and prognosis.    Right ant vertex 1.0 cm brown papule of right ant vertex scalp.        Assessment & Plan  Seborrheic keratosis Scalp  Benign. Advised patient may continue to get bigger. Discussed treatment options.   Inflamed seborrheic keratosis Right ant vertex scalp  Destruction of lesion - Right ant vertex Complexity: simple   Destruction method comment:  Electrodesiccation Informed consent: discussed and consent obtained   Timeout:  patient name, date of birth, surgical site, and procedure verified Patient was prepped and draped in usual sterile fashion: patient was prepped with isopropyl alcohol. Outcome: patient tolerated procedure well with no complications    Actinic Damage - chronic, secondary to cumulative UV radiation exposure/sun exposure over time - diffuse scaly erythematous macules with underlying dyspigmentation - Recommend daily broad spectrum sunscreen SPF 30+ to sun-exposed areas, reapply every 2 hours as needed.  - Recommend staying in the shade or wearing long sleeves, sun glasses (UVA+UVB protection) and wide  brim hats (4-inch brim around the entire circumference of the hat). - Call for new or changing lesions.  Return if symptoms worsen or fail to improve.  I, Joanie Coddington, CMA, am acting as scribe for Armida Sans, MD . Documentation: I have reviewed the above documentation for accuracy and completeness, and I agree with the above.  Armida Sans, MD

## 2021-08-02 NOTE — Patient Instructions (Signed)

## 2021-08-07 ENCOUNTER — Encounter: Payer: Self-pay | Admitting: Dermatology

## 2022-05-15 ENCOUNTER — Other Ambulatory Visit (HOSPITAL_COMMUNITY): Payer: Self-pay | Admitting: Sports Medicine

## 2022-05-15 ENCOUNTER — Other Ambulatory Visit: Payer: Self-pay | Admitting: Sports Medicine

## 2022-05-15 DIAGNOSIS — M542 Cervicalgia: Secondary | ICD-10-CM

## 2022-05-15 DIAGNOSIS — M4722 Other spondylosis with radiculopathy, cervical region: Secondary | ICD-10-CM

## 2022-05-15 DIAGNOSIS — M5030 Other cervical disc degeneration, unspecified cervical region: Secondary | ICD-10-CM

## 2022-09-03 ENCOUNTER — Encounter: Payer: Self-pay | Admitting: Neurosurgery

## 2022-09-03 ENCOUNTER — Ambulatory Visit
Admission: RE | Admit: 2022-09-03 | Discharge: 2022-09-03 | Disposition: A | Discharge status: Home / Self Care | Payer: Self-pay | Source: Ambulatory Visit | Attending: Neurosurgery | Admitting: Neurosurgery

## 2022-09-03 ENCOUNTER — Other Ambulatory Visit: Payer: Self-pay

## 2022-09-03 DIAGNOSIS — Z049 Encounter for examination and observation for unspecified reason: Secondary | ICD-10-CM

## 2022-09-03 NOTE — Progress Notes (Unsigned)
Referring Physician:  Merri Ray, MD [address] [place],  [place] [address]  Primary Physician:  Gracelyn Nurse, MD  History of Present Illness: 09/03/2022 [name] is here today with a chief complaint of neck pain that radiates into the right shoulder/arm and down into the hand. He does have some numbness and tingling in the arm and fingers. He has dropped some things and some issues with balance.  His symptoms began earlier this year with progressive worsening over time.  The numbness in his right arm is bothering him significantly.  His dexterity is now impaired to the point where it is difficult to button buttons.  He has not had any falls but does feel off balance at times.  Certain movements make his neck pain worse.  Nothing has really helped.   Bowel/Bladder Dysfunction: none  Conservative measures:  Physical therapy:  has participated in 12 visits at [facility] 02/25/22-07/12/22 Multimodal medical therapy including regular antiinflammatories:  gabapentin, tylenol, diclofenac, tizanidine, prednisone, meloxicam, celebrex Injections: has not had epidural steroid injections for his neck 10/29/21: left carpal tunnel injection 10/16/21: right carpal tunnel injection  Past Surgery: denies  Alejandro Mendoza has symptoms of cervical myelopathy.  The symptoms are causing a significant impact on the patient's life.   I have utilized the care everywhere function in epic to review the outside records available from external health systems.  Review of Systems:  A 10 point review of systems is negative, except for the pertinent positives and negatives detailed in the HPI.  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    Hyperlipidemia    Hypertension    Inflammatory arthritis    Neuropathy    Slipped disc in neck     Past Surgical History: Past Surgical History:  Procedure Laterality Date   COLONOSCOPY     CONTINUOUS NERVE MONITORING N/A 06/10/2018   Procedure: FACIAL NERVE MONITORING;  Surgeon: Geanie Logan, MD;  Location: [facility] ORS;  Service: ENT;  Laterality: N/A;   CYST EXCISION     FROM INSIDE IN JAW   EPIDURAL BLOCK INJECTION     SALIVARY STONE REMOVAL Left 06/10/2018   Procedure: SALIVARY STONE REMOVAL/SIALODUCOPLASTY;  Surgeon: Geanie Logan, MD;  Location: [facility] ORS;  Service: ENT;  Laterality: Left;   SUBMANDIBULAR GLAND EXCISION Left 06/10/2018   Procedure: EXCISION SUBMANDIBULAR GLAND;  Surgeon: Geanie Logan, MD;  Location: [facility] ORS;  Service: ENT;  Laterality: Left;    Allergies: Allergies as of 09/04/2022 - Review Complete 08/07/2021  Allergen Reaction Noted   Antihistamines, chlorpheniramine-type Other (See Comments) 05/20/2022   Aspartame Other (See Comments) 04/09/2001   Lactose Other (See Comments) 05/28/2018   Lactose intolerance (gi) Other (See Comments) 05/28/2018   Methotrexate Other (See Comments) 05/20/2022   Nsaids Other (See Comments) 06/03/2018   Other Other (See Comments) 11/01/2017   Sucralose Other (See Comments) 10/05/2004   Sulfa antibiotics  11/01/2017   Sulfamethoxazole Other (See Comments) 05/20/2022   Zonisamide Other (See Comments) 04/09/2001   Hydroxychloroquine Other (See Comments), Rash, and Swelling 11/08/2016   Hydroxychloroquine sulfate Hives and Rash 10/13/2017   Lisinopril Swelling, Hives, and Rash 06/18/2004    Medications: No outpatient medications have been  marked as taking for the 09/04/22 encounter (Appointment) with Venetia Night, MD.    Social History: Social History   Tobacco Use   Smoking status: Former    Packs/day: 0.50    Years: 15.00    Total pack years: 7.50    Types: Cigarettes    Quit date: 06/03/1994    Years  since quitting: 28.2   Smokeless tobacco: Never  Vaping Use   Vaping Use: Never used  Substance Use Topics   Alcohol use: Yes    Comment: WINE OCC   Drug use: Never    Family Medical History: No family history on file.  Physical Examination: There were no vitals filed for this visit.  General: Patient is well developed, well nourished, calm, collected, and in no apparent distress. Attention to examination is appropriate.  Neck:   Supple.  Full range of motion.  Respiratory: Patient is breathing without any difficulty.   NEUROLOGICAL:     Awake, alert, oriented to person, place, and time.  Speech is clear and fluent. Fund of knowledge is appropriate.   Cranial Nerves: Pupils equal round and reactive to light.  Facial tone is symmetric.  Facial sensation is symmetric. Shoulder shrug is symmetric. Tongue protrusion is midline.  There is no pronator drift.  ROM of spine: full.    Strength: Side Biceps Triceps Deltoid Interossei Grip Wrist Ext. Wrist Flex.  R 5 4 5 5 5 5 5   L 5 5 5 5 5 5 5    Side Iliopsoas Quads Hamstring PF DF EHL  R 5 5 5 5 5 5   L 5 5 5 5 5 5    Reflexes are 3+ and symmetric at the biceps, triceps, brachioradialis, patella and achilles.   Hoffman's is present on the right.   Bilateral upper and lower extremity sensation is intact to light touch with exception of right arm, which has diminished light touch.    No evidence of dysmetria noted.  Gait is wide-based and abnormal.  He has moderate difficulty with tandem gait..     Medical Decision Making  Imaging: MRI cervical spine shows multilevel degenerative disc disease and facet arthrosis.  This is most notable for severe spinal canal and severe bilateral neuroforaminal stenosis at C3-4 and C4-5.  There is severe bilateral neuroforaminal stenosis at C5-6.  There is mild to moderate bilateral neuroforaminal stenosis and mild spinal canal narrowing at C6-7.  There is abnormal increased T2 weighted signal  intensity in the severely compressed spinal cord at C3-4 and C4-5 that probably represents myelomalacia and/or cord edema.  There is an incompletely imaged cyst like lesion just inferior to the hyoid bone.  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  Recommend CT of the neck if further evaluation is warranted.   I have personally reviewed the images and agree with the above interpretation.  Assessment and Plan: Mr. Rea is a pleasant 66 y.o. male with cervical myelopathy, cervical stenosis at C3-4 and C4-5, and cervical radiculopathy due to C5-6 foraminal stenosis.  He has critical stenosis at C3-4 and C4-5 including behind the C4 vertebral body.  He has myelomalacia between C3 and C5.  He has clinical myelopathy with hyperreflexia and difficulty with walking due to his balance.  There is no role for conservative management for this condition.  I have recommended surgical intervention with C4 corpectomy and C5-6 anterior cervical discectomy with C3-6 anterior cervical plating.  I discussed the planned procedure at length with the patient, including the  risks, benefits, alternatives, and indications. The risks discussed include but are not limited to bleeding, infection, need for reoperation, spinal fluid leak, stroke, vision loss, anesthetic complication, coma, paralysis, and even death. We also discussed the possibility of post-operative dysphagia, vocal cord paralysis, and the risk of adjacent segment disease in the future. I also described in detail that improvement was not guaranteed.  The patient expressed understanding of these risks, and asked that we proceed with surgery. I described the surgery in layman's terms, and gave ample opportunity for questions, which were answered to the best of my ability.  I spent a total of 40 minutes in this patient's care today. This time was spent reviewing pertinent records including imaging studies, obtaining and confirming history,  performing a directed evaluation, formulating and discussing my recommendations, and documenting the visit within the medical record.      Thank you for involving me in the care of this patient.      Emyah Roznowski K. Myer Haff MD, [facility] Neurosurgery

## 2022-09-04 ENCOUNTER — Encounter: Payer: Self-pay | Admitting: Neurosurgery

## 2022-09-04 ENCOUNTER — Ambulatory Visit (INDEPENDENT_AMBULATORY_CARE_PROVIDER_SITE_OTHER): Payer: Medicare Other | Admitting: Neurosurgery

## 2022-09-04 VITALS — BP 148/82 | Ht 75.0 in | Wt 259.6 lb

## 2022-09-04 DIAGNOSIS — M5412 Radiculopathy, cervical region: Secondary | ICD-10-CM

## 2022-09-04 DIAGNOSIS — G959 Disease of spinal cord, unspecified: Secondary | ICD-10-CM

## 2022-09-04 DIAGNOSIS — M4802 Spinal stenosis, cervical region: Secondary | ICD-10-CM | POA: Diagnosis not present

## 2022-09-04 NOTE — Patient Instructions (Signed)
Please see below for information in regards to your upcoming surgery:  Planned surgery: C3-6 instrumentation, C4 corpectomy, C5-6 anterior cervical discectomy and fusion   Surgery date: 11/04/22 - you will find out your arrival time the business day before your surgery.   Pre-op appointment at [facility] Pre-admit Testing: we will call you with a date/time for this. Pre-admit testing is located on the first floor of the [facility] building, [address] [facility] [address]. Please bring all prescriptions in the original prescription bottles to your appointment, even if you have reviewed medications by phone with a pharmacy representative. Pre-op labs may be done at your pre-op appointment. You are not required to fast for these labs. Should you need to change your pre-op appointment, please call Pre-admit testing at [phone].      Brace: [facility] will contact you regarding an appointment for the brace you will use after surgery. Their number is [phone] should you miss their call or have an issue with your brace after surgery. You will need to bring the brace to the hospital on the day of surgery.    NSAIDS (Non-steroidal anti-inflammatory drugs): because you are having a fusion, no NSAIDS (such as ibuprofen, aleve, naproxen, meloxicam, diclofenac) for 3 months after surgery. Celebrex is an exception. Tylenol is ok because it is not an NSAID.    Home health physical therapy: [facility] will contact you regarding home health physical therapy for after surgery.Their number is [phone].    Because you are having a fusion: for appointments after your 2 week follow-up: please arrive at the [facility] outpatient imaging center ([address], [address], [place]) or [facility] one hour prior to your appointment for x-rays. This applies to every appointment after your 2 week follow-up. Failure to do so may result in your  appointment being rescheduled.   If you have FMLA/disability paperwork, please drop it off or fax it to [phone], attention [place].   We can be reached by phone or mychart 8am-4pm, Monday-Friday. If you have any questions/concerns before or after surgery, you can reach [place] at [phone], or you can send a mychart message. If you have a concern after hours that cannot wait until normal business hours, you can call [phone] and ask to page the neurosurgeon on call for [place].    Appointments/FMLA & disability paperwork: [place] Nurse: Royston Cowper  Medical assistant: Irving Burton Physician Assistant's: Manning Charity & Drake Leach Surgeon: Venetia Night, MD

## 2022-09-30 ENCOUNTER — Other Ambulatory Visit: Payer: Self-pay

## 2022-09-30 DIAGNOSIS — Z01818 Encounter for other preprocedural examination: Secondary | ICD-10-CM

## 2022-10-22 ENCOUNTER — Telehealth: Payer: Self-pay

## 2022-10-22 ENCOUNTER — Encounter
Admission: RE | Admit: 2022-10-22 | Discharge: 2022-10-22 | Disposition: A | Discharge status: Home / Self Care | Payer: Medicare Other | Source: Ambulatory Visit | Attending: Neurosurgery | Admitting: Neurosurgery

## 2022-10-22 DIAGNOSIS — I10 Essential (primary) hypertension: Secondary | ICD-10-CM | POA: Insufficient documentation

## 2022-10-22 DIAGNOSIS — Z01818 Encounter for other preprocedural examination: Secondary | ICD-10-CM | POA: Diagnosis not present

## 2022-10-22 DIAGNOSIS — Z01812 Encounter for preprocedural laboratory examination: Secondary | ICD-10-CM

## 2022-10-22 DIAGNOSIS — E119 Type 2 diabetes mellitus without complications: Secondary | ICD-10-CM | POA: Insufficient documentation

## 2022-10-22 LAB — CBC
HCT: 40.7 % (ref 39.0–52.0)
Hemoglobin: 13.8 g/dL (ref 13.0–17.0)
MCH: 29.6 pg (ref 26.0–34.0)
MCHC: 33.9 g/dL (ref 30.0–36.0)
MCV: 87.3 fL (ref 80.0–100.0)
Platelets: 196 10*3/uL (ref 150–400)
RBC: 4.66 MIL/uL (ref 4.22–5.81)
RDW: 13.8 % (ref 11.5–15.5)
WBC: 5 10*3/uL (ref 4.0–10.5)
nRBC: 0 % (ref 0.0–0.2)

## 2022-10-22 LAB — TYPE AND SCREEN
ABO/RH(D): A POS
Antibody Screen: NEGATIVE

## 2022-10-22 LAB — SURGICAL PCR SCREEN
MRSA, PCR: NEGATIVE
Staphylococcus aureus: NEGATIVE

## 2022-10-22 NOTE — Telephone Encounter (Signed)
-----  Message from Peggyann Shoals sent at 10/21/2022  4:02 PM EST ----- Regarding: surgery questions Contact: [phone] C3-6 instrumentation, C4 corpectomy, C5-6 ACDF on 11/04/22 Patient has questions regarding his surgery. You can send him a mychart message or call him.  1. What percentage will the numbness in his fingers go away right after surgery? 50%, 100% or will it take time?  2. What is the chance of getting an infection after surgery?  3. How long after surgery will it take for a full recovery?  4. What exactly will be going into his back? Will he have a plate, disc, or screws? What is it made out of?

## 2022-10-22 NOTE — Patient Instructions (Addendum)
Your procedure is scheduled on:  Monday, February 12 Report to the Registration Desk on the 1st floor of the [facility]. To find out your arrival time, please call [phone] between 1PM - 3PM on: Friday, February 9 If your arrival time is 6:00 am, do not arrive prior to that time as the [place] entrance doors do not open until 6:00 am.  REMEMBER: Instructions that are not followed completely may result in serious medical risk, up to and including death; or upon the discretion of your surgeon and anesthesiologist your surgery may need to be rescheduled.  Do not eat food after midnight the night before surgery.  No gum chewing, lozengers or hard candies.  You may however, drink water up to 2 hours before you are scheduled to arrive for your surgery. Do not drink anything within 2 hours of your scheduled arrival time.  TAKE THESE MEDICATIONS THE MORNING OF SURGERY WITH A SIP OF WATER:  Diltiazem  Use albuterol inhaler on the day of surgery and bring to the hospital.  One week prior to surgery: starting February 5 Stop Anti-inflammatories (NSAIDS) such as Advil, Aleve, Ibuprofen, Motrin, Naproxen, Naprosyn and Aspirin based products such as Excedrin, Goodys Powder, BC Powder. Stop ANY OVER THE COUNTER supplements until after surgery. Stop preservision Areds, fish oil You may however, continue to take Tylenol if needed for pain up until the day of surgery.  Per Dr. Izora Ribas; you may continue taking the meloxicam.  No Alcohol for 24 hours before or after surgery.  No Smoking including e-cigarettes for 24 hours prior to surgery.  No chewable tobacco products for at least 6 hours prior to surgery.  No nicotine patches on the day of surgery.  Do not use any "recreational" drugs for at least a week prior to your surgery.  Please be advised that the combination of cocaine and anesthesia may have negative outcomes, up to and including death. If you test positive for cocaine, your  surgery will be cancelled.  On the morning of surgery brush your teeth with toothpaste and water, you may rinse your mouth with mouthwash if you wish. Do not swallow any toothpaste or mouthwash.  Use CHG Soap as directed on instruction sheet.  Do not wear jewelry, make-up, hairpins, clips or nail polish.  Do not wear lotions, powders, or perfumes.   Do not shave body from the neck down 48 hours prior to surgery just in case you cut yourself which could leave a site for infection.  Also, freshly shaved skin may become irritated if using the CHG soap.  Contact lenses, hearing aids and dentures may not be worn into surgery.  Do not bring valuables to the hospital. [facility] is not responsible for any missing/lost belongings or valuables.   Notify your doctor if there is any change in your medical condition (cold, fever, infection).  Wear comfortable clothing (specific to your surgery type) to the hospital.  After surgery, you can help prevent lung complications by doing breathing exercises.  Take deep breaths and cough every 1-2 hours. Your doctor may order a device called an Incentive Spirometer to help you take deep breaths.  If you are being admitted to the hospital overnight, leave your suitcase in the car. After surgery it may be brought to your room.  If you are being discharged the day of surgery, you will not be allowed to drive home. You will need a responsible adult (18 years or older) to drive you home and stay  with you that night.   If you are taking public transportation, you will need to have a responsible adult (18 years or older) with you. Please confirm with your physician that it is acceptable to use public transportation.   Please call the [place] Dept. at [phone] if you have any questions about these instructions.  Surgery Visitation Policy:  Patients undergoing a surgery or procedure may have two family members or support persons with  them as long as the person is not COVID-19 positive or experiencing its symptoms.   Inpatient Visitation:    Visiting hours are 7 a.m. to 8 p.m. Up to four visitors are allowed at one time in a patient room. The visitors may rotate out with other people during the day. One designated support person (adult) may remain overnight.  Due to an increase in RSV and influenza rates and associated hospitalizations, children ages 43 and under will not be able to visit patients in [facility]. Masks continue to be strongly recommended.     Preparing for Surgery with CHLORHEXIDINE GLUCONATE (CHG) Soap  Chlorhexidine Gluconate (CHG) Soap  o An antiseptic cleaner that kills germs and bonds with the skin to continue killing germs even after washing  o Used for showering the night before surgery and morning of surgery  Before surgery, you can play an important role by reducing the number of germs on your skin.  CHG (Chlorhexidine gluconate) soap is an antiseptic cleanser which kills germs and bonds with the skin to continue killing germs even after washing.  Please do not use if you have an allergy to CHG or antibacterial soaps. If your skin becomes reddened/irritated stop using the CHG.  1. Shower the NIGHT BEFORE SURGERY and the MORNING OF SURGERY with CHG soap.  2. If you choose to wash your hair, wash your hair first as usual with your normal shampoo.  3. After shampooing, rinse your hair and body thoroughly to remove the shampoo.  4. Use CHG as you would any other liquid soap. You can apply CHG directly to the skin and wash gently with a scrungie or a clean washcloth.  5. Apply the CHG soap to your body only from the neck down. Do not use on open wounds or open sores. Avoid contact with your eyes, ears, mouth, and genitals (private parts). Wash face and genitals (private parts) with your normal soap.  6. Wash thoroughly, paying special attention to the area where your surgery will  be performed.  7. Thoroughly rinse your body with warm water.  8. Do not shower/wash with your normal soap after using and rinsing off the CHG soap.  9. Pat yourself dry with a clean towel.  10. Wear clean pajamas to bed the night before surgery.  12. Place clean sheets on your bed the night of your first shower and do not sleep with pets.  13. Shower again with the CHG soap on the day of surgery prior to arriving at the hospital.  14. Do not apply any deodorants/lotions/powders.  15. Please wear clean clothes to the hospital.

## 2022-10-22 NOTE — Telephone Encounter (Signed)
Left message on voice mail to log on to his mychart to review Dr.Yarbrough's response.

## 2022-10-23 NOTE — Telephone Encounter (Signed)
Left message to check his mychart or call the office.

## 2022-10-23 NOTE — Telephone Encounter (Signed)
Patient called back he will check mychart and call if he has any questions.

## 2022-11-04 ENCOUNTER — Inpatient Hospital Stay: Payer: Medicare Other

## 2022-11-04 ENCOUNTER — Encounter
Admission: RE | Disposition: A | Discharge status: Home Health | Payer: Self-pay | Source: Home / Self Care | Attending: Neurosurgery

## 2022-11-04 ENCOUNTER — Inpatient Hospital Stay
Admission: RE | Admit: 2022-11-04 | Discharge: 2022-11-05 | DRG: 472 | Disposition: A | Discharge status: Home Health | Payer: Medicare Other | Attending: Neurosurgery | Admitting: Neurosurgery

## 2022-11-04 ENCOUNTER — Other Ambulatory Visit: Payer: Self-pay

## 2022-11-04 ENCOUNTER — Encounter: Payer: Self-pay | Admitting: Neurosurgery

## 2022-11-04 ENCOUNTER — Inpatient Hospital Stay: Payer: Medicare Other | Admitting: Urgent Care

## 2022-11-04 DIAGNOSIS — M2578 Osteophyte, vertebrae: Secondary | ICD-10-CM | POA: Diagnosis present

## 2022-11-04 DIAGNOSIS — Z87891 Personal history of nicotine dependence: Secondary | ICD-10-CM | POA: Diagnosis not present

## 2022-11-04 DIAGNOSIS — E785 Hyperlipidemia, unspecified: Secondary | ICD-10-CM | POA: Diagnosis present

## 2022-11-04 DIAGNOSIS — M5412 Radiculopathy, cervical region: Secondary | ICD-10-CM | POA: Diagnosis present

## 2022-11-04 DIAGNOSIS — I10 Essential (primary) hypertension: Secondary | ICD-10-CM

## 2022-11-04 DIAGNOSIS — G959 Disease of spinal cord, unspecified: Secondary | ICD-10-CM | POA: Diagnosis not present

## 2022-11-04 DIAGNOSIS — Z01818 Encounter for other preprocedural examination: Secondary | ICD-10-CM

## 2022-11-04 DIAGNOSIS — J45909 Unspecified asthma, uncomplicated: Secondary | ICD-10-CM | POA: Diagnosis present

## 2022-11-04 DIAGNOSIS — E119 Type 2 diabetes mellitus without complications: Secondary | ICD-10-CM

## 2022-11-04 DIAGNOSIS — E1142 Type 2 diabetes mellitus with diabetic polyneuropathy: Secondary | ICD-10-CM | POA: Diagnosis present

## 2022-11-04 DIAGNOSIS — R292 Abnormal reflex: Secondary | ICD-10-CM | POA: Diagnosis present

## 2022-11-04 DIAGNOSIS — Z01812 Encounter for preprocedural laboratory examination: Principal | ICD-10-CM

## 2022-11-04 DIAGNOSIS — M4802 Spinal stenosis, cervical region: Secondary | ICD-10-CM | POA: Diagnosis present

## 2022-11-04 DIAGNOSIS — G992 Myelopathy in diseases classified elsewhere: Secondary | ICD-10-CM | POA: Diagnosis present

## 2022-11-04 DIAGNOSIS — G9589 Other specified diseases of spinal cord: Secondary | ICD-10-CM | POA: Diagnosis present

## 2022-11-04 HISTORY — PX: ANTERIOR CERVICAL DECOMP/DISCECTOMY FUSION: SHX1161

## 2022-11-04 LAB — GLUCOSE, CAPILLARY
Glucose-Capillary: 125 mg/dL — ABNORMAL HIGH (ref 70–99)
Glucose-Capillary: 147 mg/dL — ABNORMAL HIGH (ref 70–99)

## 2022-11-04 LAB — ABO/RH: ABO/RH(D): A POS

## 2022-11-04 SURGERY — ANTERIOR CERVICAL DECOMPRESSION/DISCECTOMY FUSION 3 LEVELS
Anesthesia: General | Site: Spine Cervical

## 2022-11-04 MED ORDER — SURGIFLO WITH THROMBIN (HEMOSTATIC MATRIX KIT) OPTIME
TOPICAL | Status: DC | PRN
  Administered 2022-11-04: 1 via TOPICAL

## 2022-11-04 MED ORDER — FENTANYL CITRATE (PF) 100 MCG/2ML IJ SOLN
INTRAMUSCULAR | Status: AC
  Filled 2022-11-04: qty 2

## 2022-11-04 MED ORDER — EPHEDRINE SULFATE (PRESSORS) 50 MG/ML IJ SOLN
INTRAMUSCULAR | Status: DC | PRN
  Administered 2022-11-04 (×2): 10 mg via INTRAVENOUS
  Administered 2022-11-04: 5 mg via INTRAVENOUS

## 2022-11-04 MED ORDER — SODIUM CHLORIDE 0.9 % IV SOLN: INTRAVENOUS | Status: DC

## 2022-11-04 MED ORDER — PROPOFOL 500 MG/50ML IV EMUL
INTRAVENOUS | Status: DC | PRN
  Administered 2022-11-04: 115 ug/kg/min via INTRAVENOUS

## 2022-11-04 MED ORDER — SENNA 8.6 MG PO TABS: 1.0000 | ORAL_TABLET | Freq: Two times a day (BID) | ORAL | Status: DC

## 2022-11-04 MED ORDER — PROPOFOL 1000 MG/100ML IV EMUL
INTRAVENOUS | Status: AC
  Filled 2022-11-04: qty 100

## 2022-11-04 MED ORDER — HYDROMORPHONE HCL 1 MG/ML IJ SOLN
INTRAMUSCULAR | Status: AC
  Filled 2022-11-04: qty 1

## 2022-11-04 MED ORDER — IPRATROPIUM BROMIDE 0.03 % NA SOLN: 2.0000 | Freq: Every evening | NASAL | Status: DC | PRN

## 2022-11-04 MED ORDER — FAMOTIDINE 20 MG PO TABS
ORAL_TABLET | ORAL | Status: AC
  Administered 2022-11-04: 20 mg via ORAL
  Filled 2022-11-04: qty 1

## 2022-11-04 MED ORDER — PHENYLEPHRINE HCL-NACL 20-0.9 MG/250ML-% IV SOLN
INTRAVENOUS | Status: AC
  Filled 2022-11-04: qty 250

## 2022-11-04 MED ORDER — KETAMINE HCL 10 MG/ML IJ SOLN
INTRAMUSCULAR | Status: DC | PRN
  Administered 2022-11-04: 30 mg via INTRAVENOUS
  Administered 2022-11-04 (×2): 10 mg via INTRAVENOUS

## 2022-11-04 MED ORDER — CEFAZOLIN IN SODIUM CHLORIDE 2-0.9 GM/100ML-% IV SOLN
2.0000 g | Freq: Once | INTRAVENOUS | Status: DC
  Filled 2022-11-04: qty 100

## 2022-11-04 MED ORDER — SODIUM CHLORIDE 0.9% FLUSH: 3.0000 mL | INTRAVENOUS | Status: DC | PRN

## 2022-11-04 MED ORDER — METHOCARBAMOL 1000 MG/10ML IJ SOLN
500.0000 mg | Freq: Four times a day (QID) | INTRAVENOUS | Status: DC | PRN
  Administered 2022-11-04: 500 mg via INTRAVENOUS
  Filled 2022-11-04: qty 500

## 2022-11-04 MED ORDER — OXYCODONE HCL 5 MG PO TABS
ORAL_TABLET | ORAL | Status: AC
  Filled 2022-11-04: qty 2

## 2022-11-04 MED ORDER — CHLORHEXIDINE GLUCONATE 0.12 % MT SOLN: 15.0000 mL | Freq: Once | OROMUCOSAL | Status: AC

## 2022-11-04 MED ORDER — SODIUM CHLORIDE 0.9% FLUSH
3.0000 mL | Freq: Two times a day (BID) | INTRAVENOUS | Status: DC
  Administered 2022-11-04 – 2022-11-05 (×3): 3 mL via INTRAVENOUS

## 2022-11-04 MED ORDER — IRBESARTAN 150 MG PO TABS
300.0000 mg | ORAL_TABLET | Freq: Every day | ORAL | Status: DC
  Administered 2022-11-05: 300 mg via ORAL
  Filled 2022-11-04 (×2): qty 2

## 2022-11-04 MED ORDER — POLYETHYLENE GLYCOL 3350 17 G PO PACK: 17.0000 g | PACK | Freq: Every day | ORAL | Status: DC | PRN

## 2022-11-04 MED ORDER — HYDROMORPHONE HCL 1 MG/ML IJ SOLN
INTRAMUSCULAR | Status: AC
  Administered 2022-11-04: 0.5 mg via INTRAVENOUS
  Filled 2022-11-04: qty 1

## 2022-11-04 MED ORDER — BISACODYL 10 MG RE SUPP: 10.0000 mg | Freq: Every day | RECTAL | Status: DC | PRN

## 2022-11-04 MED ORDER — DOCUSATE SODIUM 100 MG PO CAPS
ORAL_CAPSULE | ORAL | Status: AC
  Administered 2022-11-04: 100 mg
  Filled 2022-11-04: qty 1

## 2022-11-04 MED ORDER — GLYCOPYRROLATE 0.2 MG/ML IJ SOLN
INTRAMUSCULAR | Status: AC
  Filled 2022-11-04: qty 1

## 2022-11-04 MED ORDER — PHENOL 1.4 % MT LIQD: 1.0000 | OROMUCOSAL | Status: DC | PRN

## 2022-11-04 MED ORDER — NAPHAZOLINE-GLYCERIN 0.012-0.25 % OP SOLN: 1.0000 [drp] | Freq: Four times a day (QID) | OPHTHALMIC | Status: DC | PRN

## 2022-11-04 MED ORDER — SODIUM CHLORIDE 0.9 % IV SOLN: 250.0000 mL | INTRAVENOUS | Status: DC

## 2022-11-04 MED ORDER — TERAZOSIN HCL 5 MG PO CAPS
5.0000 mg | ORAL_CAPSULE | Freq: Every day | ORAL | Status: DC
  Administered 2022-11-04: 5 mg via ORAL
  Filled 2022-11-04: qty 1

## 2022-11-04 MED ORDER — DEXAMETHASONE SODIUM PHOSPHATE 10 MG/ML IJ SOLN
INTRAMUSCULAR | Status: DC | PRN
  Administered 2022-11-04: 10 mg via INTRAVENOUS

## 2022-11-04 MED ORDER — REMIFENTANIL HCL 1 MG IV SOLR
INTRAVENOUS | Status: AC
  Filled 2022-11-04: qty 1000

## 2022-11-04 MED ORDER — OXYCODONE HCL 5 MG PO TABS
ORAL_TABLET | ORAL | Status: AC
  Administered 2022-11-04: 5 mg via ORAL
  Filled 2022-11-04: qty 1

## 2022-11-04 MED ORDER — FLUTICASONE PROPIONATE 50 MCG/ACT NA SUSP: 2.0000 | Freq: Every evening | NASAL | Status: DC | PRN

## 2022-11-04 MED ORDER — BUPIVACAINE HCL (PF) 0.5 % IJ SOLN
INTRAMUSCULAR | Status: AC
  Filled 2022-11-04: qty 30

## 2022-11-04 MED ORDER — BUPIVACAINE-EPINEPHRINE 0.5% -1:200000 IJ SOLN
INTRAMUSCULAR | Status: DC | PRN
  Administered 2022-11-04: 5 mL

## 2022-11-04 MED ORDER — OXYCODONE HCL 5 MG PO TABS
10.0000 mg | ORAL_TABLET | ORAL | Status: DC | PRN
  Administered 2022-11-04: 10 mg via ORAL

## 2022-11-04 MED ORDER — SENNA 8.6 MG PO TABS
ORAL_TABLET | ORAL | Status: AC
  Administered 2022-11-04: 8.6 mg via ORAL
  Filled 2022-11-04: qty 1

## 2022-11-04 MED ORDER — ORAL CARE MOUTH RINSE: 15.0000 mL | Freq: Once | OROMUCOSAL | Status: AC

## 2022-11-04 MED ORDER — FAMOTIDINE 20 MG PO TABS: 20.0000 mg | ORAL_TABLET | Freq: Once | ORAL | Status: AC

## 2022-11-04 MED ORDER — PROPOFOL 10 MG/ML IV BOLUS
INTRAVENOUS | Status: AC
  Filled 2022-11-04: qty 20

## 2022-11-04 MED ORDER — SODIUM CHLORIDE 0.9 % IV SOLN
INTRAVENOUS | Status: DC | PRN
  Administered 2022-11-04: .1 ug/kg/min via INTRAVENOUS

## 2022-11-04 MED ORDER — ATORVASTATIN CALCIUM 20 MG PO TABS
10.0000 mg | ORAL_TABLET | Freq: Every day | ORAL | Status: DC
  Administered 2022-11-04: 10 mg via ORAL
  Filled 2022-11-04: qty 0.5

## 2022-11-04 MED ORDER — CHLORHEXIDINE GLUCONATE 0.12 % MT SOLN
OROMUCOSAL | Status: AC
  Administered 2022-11-04: 15 mL via OROMUCOSAL
  Filled 2022-11-04: qty 15

## 2022-11-04 MED ORDER — HYDROMORPHONE HCL 1 MG/ML IJ SOLN
INTRAMUSCULAR | Status: DC | PRN
  Administered 2022-11-04 (×2): .5 mg via INTRAVENOUS

## 2022-11-04 MED ORDER — ONDANSETRON HCL 4 MG/2ML IJ SOLN: 4.0000 mg | Freq: Once | INTRAMUSCULAR | Status: DC | PRN

## 2022-11-04 MED ORDER — ONDANSETRON HCL 4 MG PO TABS: 4.0000 mg | ORAL_TABLET | Freq: Four times a day (QID) | ORAL | Status: DC | PRN

## 2022-11-04 MED ORDER — DOCUSATE SODIUM 100 MG PO CAPS
ORAL_CAPSULE | ORAL | Status: AC
  Administered 2022-11-04: 100 mg via ORAL
  Filled 2022-11-04: qty 1

## 2022-11-04 MED ORDER — ACETAMINOPHEN 10 MG/ML IV SOLN
INTRAVENOUS | Status: DC | PRN
  Administered 2022-11-04: 1000 mg via INTRAVENOUS

## 2022-11-04 MED ORDER — CEFAZOLIN SODIUM-DEXTROSE 2-4 GM/100ML-% IV SOLN
2.0000 g | Freq: Once | INTRAVENOUS | Status: AC
  Administered 2022-11-04: 2 g via INTRAVENOUS

## 2022-11-04 MED ORDER — DEXAMETHASONE SODIUM PHOSPHATE 10 MG/ML IJ SOLN
INTRAMUSCULAR | Status: AC
  Filled 2022-11-04: qty 1

## 2022-11-04 MED ORDER — ACETAMINOPHEN 10 MG/ML IV SOLN
INTRAVENOUS | Status: AC
  Filled 2022-11-04: qty 100

## 2022-11-04 MED ORDER — 0.9 % SODIUM CHLORIDE (POUR BTL) OPTIME
TOPICAL | Status: DC | PRN
  Administered 2022-11-04: 500 mL

## 2022-11-04 MED ORDER — CEFAZOLIN SODIUM-DEXTROSE 2-4 GM/100ML-% IV SOLN
INTRAVENOUS | Status: AC
  Filled 2022-11-04: qty 100

## 2022-11-04 MED ORDER — GABAPENTIN 300 MG PO CAPS
ORAL_CAPSULE | ORAL | Status: AC
  Administered 2022-11-04: 300 mg
  Filled 2022-11-04: qty 1

## 2022-11-04 MED ORDER — OXYCODONE HCL 5 MG PO TABS
5.0000 mg | ORAL_TABLET | ORAL | Status: DC | PRN
  Administered 2022-11-05 (×2): 5 mg via ORAL

## 2022-11-04 MED ORDER — GLYCOPYRROLATE 0.2 MG/ML IJ SOLN
INTRAMUSCULAR | Status: DC | PRN
  Administered 2022-11-04 (×2): .1 mg via INTRAVENOUS

## 2022-11-04 MED ORDER — SUCCINYLCHOLINE CHLORIDE 200 MG/10ML IV SOSY
PREFILLED_SYRINGE | INTRAVENOUS | Status: AC
  Filled 2022-11-04: qty 10

## 2022-11-04 MED ORDER — ENOXAPARIN SODIUM 40 MG/0.4ML IJ SOSY: 40.0000 mg | PREFILLED_SYRINGE | INTRAMUSCULAR | Status: DC

## 2022-11-04 MED ORDER — MENTHOL 3 MG MT LOZG: 1.0000 | LOZENGE | OROMUCOSAL | Status: DC | PRN

## 2022-11-04 MED ORDER — METHOCARBAMOL 500 MG PO TABS: 500.0000 mg | ORAL_TABLET | Freq: Four times a day (QID) | ORAL | Status: DC | PRN

## 2022-11-04 MED ORDER — ACETAMINOPHEN 500 MG PO TABS
1000.0000 mg | ORAL_TABLET | Freq: Four times a day (QID) | ORAL | Status: AC
  Administered 2022-11-04 – 2022-11-05 (×2): 1000 mg via ORAL

## 2022-11-04 MED ORDER — ACETAMINOPHEN 500 MG PO TABS
ORAL_TABLET | ORAL | Status: AC
  Filled 2022-11-04: qty 2

## 2022-11-04 MED ORDER — PROPOFOL 10 MG/ML IV BOLUS
INTRAVENOUS | Status: DC | PRN
  Administered 2022-11-04: 160 mg via INTRAVENOUS

## 2022-11-04 MED ORDER — METHOCARBAMOL 500 MG PO TABS
ORAL_TABLET | ORAL | Status: AC
  Administered 2022-11-04: 500 mg
  Filled 2022-11-04: qty 1

## 2022-11-04 MED ORDER — HYDROMORPHONE HCL 1 MG/ML IJ SOLN
0.2500 mg | INTRAMUSCULAR | Status: DC | PRN
  Administered 2022-11-04 (×2): 0.5 mg via INTRAVENOUS

## 2022-11-04 MED ORDER — AMLODIPINE BESYLATE 5 MG PO TABS
10.0000 mg | ORAL_TABLET | Freq: Every day | ORAL | Status: DC
  Administered 2022-11-04: 10 mg via ORAL
  Filled 2022-11-04: qty 2

## 2022-11-04 MED ORDER — DILTIAZEM HCL ER 120 MG PO TB24
360.0000 mg | ORAL_TABLET | Freq: Every day | ORAL | Status: DC
  Administered 2022-11-05: 360 mg via ORAL
  Filled 2022-11-04: qty 3

## 2022-11-04 MED ORDER — ACETAMINOPHEN 500 MG PO TABS
ORAL_TABLET | ORAL | Status: AC
  Administered 2022-11-04: 1000 mg via ORAL
  Filled 2022-11-04: qty 2

## 2022-11-04 MED ORDER — PHENYLEPHRINE HCL-NACL 20-0.9 MG/250ML-% IV SOLN
INTRAVENOUS | Status: DC | PRN
  Administered 2022-11-04: 50 ug/min via INTRAVENOUS

## 2022-11-04 MED ORDER — REMIFENTANIL HCL 1 MG IV SOLR
INTRAVENOUS | Status: DC | PRN
  Administered 2022-11-04: 100 ug via INTRAVENOUS

## 2022-11-04 MED ORDER — LIDOCAINE HCL (PF) 2 % IJ SOLN
INTRAMUSCULAR | Status: AC
  Filled 2022-11-04: qty 5

## 2022-11-04 MED ORDER — GABAPENTIN 300 MG PO CAPS: 300.0000 mg | ORAL_CAPSULE | Freq: Every day | ORAL | Status: DC

## 2022-11-04 MED ORDER — DOCUSATE SODIUM 100 MG PO CAPS: 100.0000 mg | ORAL_CAPSULE | Freq: Two times a day (BID) | ORAL | Status: DC

## 2022-11-04 MED ORDER — ONDANSETRON HCL 4 MG/2ML IJ SOLN
INTRAMUSCULAR | Status: DC | PRN
  Administered 2022-11-04: 4 mg via INTRAVENOUS

## 2022-11-04 MED ORDER — ONDANSETRON HCL 4 MG/2ML IJ SOLN
INTRAMUSCULAR | Status: AC
  Filled 2022-11-04: qty 2

## 2022-11-04 MED ORDER — MIDAZOLAM HCL 2 MG/2ML IJ SOLN
INTRAMUSCULAR | Status: AC
  Filled 2022-11-04: qty 2

## 2022-11-04 MED ORDER — LIDOCAINE HCL (CARDIAC) PF 100 MG/5ML IV SOSY
PREFILLED_SYRINGE | INTRAVENOUS | Status: DC | PRN
  Administered 2022-11-04: 80 mg via INTRAVENOUS

## 2022-11-04 MED ORDER — KETAMINE HCL 50 MG/5ML IJ SOSY
PREFILLED_SYRINGE | INTRAMUSCULAR | Status: AC
  Filled 2022-11-04: qty 5

## 2022-11-04 MED ORDER — EPINEPHRINE PF 1 MG/ML IJ SOLN
INTRAMUSCULAR | Status: AC
  Filled 2022-11-04: qty 1

## 2022-11-04 MED ORDER — LACTATED RINGERS IV SOLN: INTRAVENOUS | Status: DC | PRN

## 2022-11-04 MED ORDER — MIDAZOLAM HCL 2 MG/2ML IJ SOLN
INTRAMUSCULAR | Status: DC | PRN
  Administered 2022-11-04: 2 mg via INTRAVENOUS

## 2022-11-04 MED ORDER — FENTANYL CITRATE (PF) 100 MCG/2ML IJ SOLN
25.0000 ug | INTRAMUSCULAR | Status: DC | PRN
  Administered 2022-11-04 (×3): 25 ug via INTRAVENOUS

## 2022-11-04 MED ORDER — SUCCINYLCHOLINE CHLORIDE 200 MG/10ML IV SOSY
PREFILLED_SYRINGE | INTRAVENOUS | Status: DC | PRN
  Administered 2022-11-04: 180 mg via INTRAVENOUS

## 2022-11-04 MED ORDER — FENTANYL CITRATE (PF) 100 MCG/2ML IJ SOLN
INTRAMUSCULAR | Status: AC
  Administered 2022-11-04: 50 ug via INTRAVENOUS
  Filled 2022-11-04: qty 2

## 2022-11-04 MED ORDER — ONDANSETRON HCL 4 MG/2ML IJ SOLN: 4.0000 mg | Freq: Four times a day (QID) | INTRAMUSCULAR | Status: DC | PRN

## 2022-11-04 MED ORDER — MAGNESIUM CITRATE PO SOLN: 1.0000 | Freq: Once | ORAL | Status: DC | PRN

## 2022-11-04 MED ORDER — OXYCODONE HCL 5 MG PO TABS
ORAL_TABLET | ORAL | Status: AC
  Administered 2022-11-04: 10 mg
  Filled 2022-11-04: qty 2

## 2022-11-04 SURGICAL SUPPLY — 56 items
BASIN KIT SINGLE STR (MISCELLANEOUS) ×1 IMPLANT
BASKET BONE COLLECTION (BASKET) IMPLANT
BULB RESERV EVAC DRAIN JP 100C (MISCELLANEOUS) IMPLANT
BUR MATCHSTICK NEURO 3.0 LAGG (BURR) IMPLANT
BUR NEURO DRILL SOFT 3.0X3.8M (BURR) ×1 IMPLANT
DERMABOND ADVANCED .7 DNX12 (GAUZE/BANDAGES/DRESSINGS) ×1 IMPLANT
DRAIN CHANNEL JP 10F RND 20C F (MISCELLANEOUS) IMPLANT
DRAPE C ARM PK CFD 31 SPINE (DRAPES) ×1 IMPLANT
DRAPE C-ARM XRAY 36X54 (DRAPES) IMPLANT
DRAPE LAPAROTOMY 77X122 PED (DRAPES) ×1 IMPLANT
DRAPE MICROSCOPE SPINE 48X150 (DRAPES) ×1 IMPLANT
DRSG OPSITE POSTOP 4X6 (GAUZE/BANDAGES/DRESSINGS) ×2 IMPLANT
FEE INTRAOP CADWELL SUPPLY NCS (MISCELLANEOUS) IMPLANT
FEE INTRAOP MONITOR IMPULS NCS (MISCELLANEOUS) IMPLANT
GLOVE SURG SYN 6.5 ES PF (GLOVE) ×2 IMPLANT
GLOVE SURG SYN 6.5 PF PI (GLOVE) ×2 IMPLANT
GLOVE SURG SYN 8.5  E (GLOVE) ×3
GLOVE SURG SYN 8.5 E (GLOVE) ×3 IMPLANT
GLOVE SURG SYN 8.5 PF PI (GLOVE) ×3 IMPLANT
GLOVE SURG UNDER POLY LF SZ6.5 (GLOVE) ×1 IMPLANT
GOWN SRG LRG LVL 4 IMPRV REINF (GOWNS) ×2 IMPLANT
GOWN SRG XL LVL 3 NONREINFORCE (GOWNS) ×1 IMPLANT
GOWN STRL NON-REIN TWL XL LVL3 (GOWNS) ×1
GOWN STRL REIN LRG LVL4 (GOWNS) ×2
INTRAOP CADWELL SUPPLY FEE NCS (MISCELLANEOUS) ×1
INTRAOP DISP SUPPLY FEE NCS (MISCELLANEOUS) ×1
INTRAOP MONITOR FEE IMPULS NCS (MISCELLANEOUS) ×1
INTRAOP MONITOR FEE IMPULSE (MISCELLANEOUS) ×1
KIT TURNOVER KIT A (KITS) ×1 IMPLANT
MANIFOLD NEPTUNE II (INSTRUMENTS) ×1 IMPLANT
NS IRRIG 1000ML POUR BTL (IV SOLUTION) ×1 IMPLANT
NS IRRIG 500ML POUR BTL (IV SOLUTION) IMPLANT
PACK LAMINECTOMY NEURO (CUSTOM PROCEDURE TRAY) ×1 IMPLANT
PAD ARMBOARD 7.5X6 YLW CONV (MISCELLANEOUS) ×2 IMPLANT
PIN CASPAR 14 (PIN) ×1 IMPLANT
PIN CASPAR 14MM (PIN) ×1
PLATE ACP 1.9X56 3LVL (Screw) IMPLANT
SCREW ACP VSD 3.5X19 (Screw) IMPLANT
SPACER C HEDRON 12X14 7M 7D (Spacer) IMPLANT
SPACER CERV TI 12X14X12 (Spacer) IMPLANT
SPACER CERV TI 12X14X12 0D (Spacer) IMPLANT
SPACER CERV TI 19-25 12 CORE (Spacer) IMPLANT
SPONGE KITTNER 5P (MISCELLANEOUS) ×2 IMPLANT
STAPLER SKIN PROX 35W (STAPLE) IMPLANT
SURGIFLO W/THROMBIN 8M KIT (HEMOSTASIS) ×1 IMPLANT
SUT DVC VLOC 3-0 CL 6 P-12 (SUTURE) IMPLANT
SUT ETHILON 3-0 FS-10 30 BLK (SUTURE) ×2
SUT V-LOC 90 ABS DVC 3-0 CL (SUTURE) ×1 IMPLANT
SUT VIC AB 3-0 SH 8-18 (SUTURE) ×1 IMPLANT
SUT VICRYL 3-0 CR8 SH (SUTURE) ×1 IMPLANT
SUTURE EHLN 3-0 FS-10 30 BLK (SUTURE) IMPLANT
SYR 20ML LL LF (SYRINGE) ×1 IMPLANT
TAPE CLOTH 3X10 WHT NS LF (GAUZE/BANDAGES/DRESSINGS) ×3 IMPLANT
TAPE TRANSPORE STRL 2 31045 (GAUZE/BANDAGES/DRESSINGS) IMPLANT
TRAP FLUID SMOKE EVACUATOR (MISCELLANEOUS) ×1 IMPLANT
TRAY FOLEY MTR SLVR 16FR STAT (SET/KITS/TRAYS/PACK) IMPLANT

## 2022-11-04 NOTE — Progress Notes (Signed)
Awake/alert x4  OK per Dr. Izora Ribas to get patient up into recliner. Able to move upper ext, open and close, grasps bil hands.

## 2022-11-04 NOTE — Op Note (Signed)
Indications: Alejandro Mendoza is a 67 year old gentleman who presented withG95.9 Cervical myelopathy, M48.02 Cervical stenosis of spinal canal, M54.12 Cervical radiculopathy .  He also had presence of myelomalacia.  Continue to clinical myelopathy, surgical intervention was recommended.   Findings: severe stenosis  Preoperative Diagnosis: G95.9 Cervical myelopathy, M48.02 Cervical stenosis of spinal canal, M54.12 Cervical radiculopathy, G95.89 Myelomalacia Postoperative Diagnosis: same   EBL: 100 ml IVF: see AR ml Drains: 1 placed Disposition: Extubated and Stable to PACU Complications: none  No foley catheter was placed.   Preoperative Note:   Risks of surgery discussed include: infection, bleeding, stroke, coma, death, paralysis, CSF leak, nerve/spinal cord injury, numbness, tingling, weakness, complex regional pain syndrome, recurrent stenosis and/or disc herniation, vascular injury, development of instability, neck/back pain, need for further surgery, persistent symptoms, development of deformity, and the risks of anesthesia. The patient understood these risks and agreed to proceed.  Operative Note:  PROCEDURES: 1. Anterior cervical corpectomy of C4, removing greater than 50% of the vertebral body at that level. 2. Anterior Arthrodesis from C3 to C6 3. Anterior cervical spine instrumentation C3 -C6 4. Insertion of biomechanic device (Globus Fortify) as a vertebral body replacement 5. Harvesting of autograft via the same incision 6. Use of the operative microscope for aid in microdissection 7.  Anterior cervical discectomy and fusion C5-6 including placement of biomechanical device  PROCEDURE IN DETAIL: After obtaining informed consent, the patient taken to the operating room, placed in supine position, general anesthesia induced.  The patient had a small shoulder roll placed behind their shoulders.  After a timeout, the patient received preop antibiotics and 10 mg of IV Decadron.   The patient had a neck incision outlined, was prepped and draped in usual sterile fashion. The incision was injected with local anesthetic.    An incision was opened, dissection taken down medial to the carotid artery and jugular vein, lateral to the trachea and esophagus.  The prevertebral fascia identified and a localizing x-ray demonstrated the correct level.  The disc was marked with the Bovie. The soft tissues and longus colli were dissected laterally from the vertebral bodies from C3 to C6 using monopolar electrocautery. Blackbelt retractors were then placed. The microscope was brought into the field for aid in microdissection.   With this complete, distractor pins were placed in the vertebral bodies of C3 and C5. The distractor was placed and the disc spaces gently distracted. The annulus at C3/4 was opened using a Bovie. Using curettes and a pituitary rongeur, the disc was removed from the endplate. Using a nerve hook, the posterior longitudinal ligament was elevated and divided. Using curettes and a 3m Kerrison punch, the posterior longitudinal ligament was removed and the spinal cord and neuroforamina decompressed. The nerve hook was used to confirm decompression.   Then, annulus at C4/5 was opened using a Bovie, cutting away from the esophagus. Using curettes and a pituitary rongeur, the disc was removed from the endplate. Using a nerve hook, the posterior longitudinal ligament was elevated and divided. Using curettes and a 269mKerrison punch, the posterior longitudinal ligament was removed and the spinal cord and neuroforamina decompressed. The nerve hook was used to confirm decompression.   After disc preparation, a portion of the C4 vertebral body was removed with a Leksell rongeur and saved for used as autograft. The high speed drill was then used to finish the corpectomy. At least 70% of the vertebral body was removed in total. The site was measured to confirm adequate width. The posterior  longitudinal ligament was then elevated from the inferior disc space and divided superiorly until the vertebral body wall was removed.  The nerve hook was then used to confirm decompression of the spinal cord. The endplates were gently decorticated for arthrodesis preparation.  After decompression, the corpectomy defect was measured and a size 121-28 mm Globus Fortify vertebral body replacement was chosen. It was filled with autograft, and then placed used flouroscopic guidance. After this was placed, the Caspar post at C3 was removed and bone wax used for hemostasis.  The high-speed drill was used to remove the anterior osteophyte at C3.  The Caspar pin was then placed at C6 and a distractor placed from C5-C6.  Using curettes and a pituitary rongeur, the disc was removed from the endplate. Using a nerve hook, the posterior longitudinal ligament was elevated and divided. Using curettes and a 48m Kerrison punch, the posterior longitudinal ligament was removed and the spinal cord and neuroforamina decompressed. The nerve hook was used to confirm decompression.  The disc space was sized and a 7 mm height by 14 mm width by 12 mm depth Globus Hedron C biomechanical device was inserted into the C5-6 disc space for arthrodesis after being filled with autograft.  After the corpectomy and anterior cervical discectomy and fusion were performed, we then turned attention to anterior instrumentation. A size 56 mm Nuvasive ACP plate was sized using flouroscopy and then placed. Two screws placed in the vertebral bodies at C3, C5, and C6,  making sure the screws were locked.  Final radiographs were taken to confirm placement.  Please note that the plate is not inclusive to the biomechanical devices.  The anchoring mechanism of the plate is completely separate from the biomechanical devices.   With everything in good position, the wound was irrigated copiously and meticulous hemostasis obtained.  A drain was placed.  Wound  was closed in 2 layers using interrupted inverted 3-0 Vicryl sutures.  The wound was dressed with dermabond, the head of bed at 30 degrees, taken to recovery room in stable condition.  No immediate complications were noted.  Electrophysiologic monitoring was stable throughout.  I performed the entire procedure with DCooper RenderPA as an aEnvironmental consultant An assistant was required for this procedure due to the complexity.  The assistant provided assistance in tissue manipulation and suction, and was required for the successful and safe performance of the procedure. I performed the critical portions of the procedure.   CMeade MawMD

## 2022-11-04 NOTE — Progress Notes (Signed)
Patient awake/alert x4.  Neck discomfort "tolerable" per patient, c/o's lumbar area discomfort, possible r/t positioning on table. Medicated as ordered, patient tolerating po fluids without event.  Moving x4 ext wiithout event. States bil lower ext "weak" but is able to bend knee's, push/pull without event. Pulses intact x4.

## 2022-11-04 NOTE — Anesthesia Procedure Notes (Signed)
Procedure Name: Intubation Date/Time: 11/04/2022 9:59 AM  Performed by: Esaw Grandchild, CRNAPre-anesthesia Checklist: Patient identified, Emergency Drugs available, Suction available and Patient being monitored Patient Re-evaluated:Patient Re-evaluated prior to induction Oxygen Delivery Method: Circle system utilized Preoxygenation: Pre-oxygenation with 100% oxygen Induction Type: IV induction Ventilation: Mask ventilation without difficulty Laryngoscope Size: McGraph and 3 Grade View: Grade I Tube type: Oral Tube size: 7.5 mm Number of attempts: 1 Airway Equipment and Method: Stylet, Oral airway, Bite block and LTA kit utilized Placement Confirmation: ETT inserted through vocal cords under direct vision, positive ETCO2 and breath sounds checked- equal and bilateral Secured at: 22 cm Tube secured with: Tape Dental Injury: Teeth and Oropharynx as per pre-operative assessment

## 2022-11-04 NOTE — H&P (Signed)
Referring Physician:  No referring provider defined for this encounter.  Primary Physician:  Baxter Hire, MD  History of Present Illness: 11/04/2022 Alejandro Mendoza presents for surgical intervention with continued symptoms.  09/04/2022 Alejandro Mendoza is here today with a chief complaint of neck pain that radiates into the right shoulder/arm and down into the hand. He does have some numbness and tingling in the arm and fingers. He has dropped some things and some issues with balance.  His symptoms began earlier this year with progressive worsening over time.  The numbness in his right arm is bothering him significantly.  His dexterity is now impaired to the point where it is difficult to button buttons.  He has not had any falls but does feel off balance at times.  Certain movements make his neck pain worse.  Nothing has really helped.   Bowel/Bladder Dysfunction: none  Conservative measures:  Physical therapy:  has participated in 12 visits at [facility] 02/25/22-07/12/22 Multimodal medical therapy including regular antiinflammatories:  gabapentin, tylenol, diclofenac, tizanidine, prednisone, meloxicam, celebrex Injections: has not had epidural steroid injections for his neck 10/29/21: left carpal tunnel injection 10/16/21: right carpal tunnel injection  Past Surgery: denies  Alejandro Mendoza has symptoms of cervical myelopathy.  The symptoms are causing a significant impact on the patient's life.   I have utilized the care everywhere function in epic to review the outside records available from external health systems.  Review of Systems:  A 10 point review of systems is negative, except for the pertinent positives and negatives detailed in the HPI.  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   Hyperlipidemia    Hypertension    Inflammatory arthritis    Neuropathy    Slipped disc in neck     Past Surgical History: Past Surgical History:  Procedure Laterality Date   COLONOSCOPY  2019   CONTINUOUS NERVE MONITORING N/A 06/10/2018   Procedure: FACIAL NERVE MONITORING;  Surgeon: Clyde Canterbury, MD;  Location: [facility] ORS;  Service: ENT;  Laterality: N/A;   [place] INJECTION     SALIVARY STONE REMOVAL Left 06/10/2018   Procedure: SALIVARY STONE REMOVAL/SIALODUCOPLASTY;  Surgeon: Clyde Canterbury, MD;  Location: [facility] ORS;  Service: ENT;  Laterality: Left;   SUBMANDIBULAR GLAND EXCISION Left 06/10/2018   Procedure: EXCISION SUBMANDIBULAR GLAND;  Surgeon: Clyde Canterbury, MD;  Location: [facility] ORS;  Service: ENT;  Laterality: Left;    Allergies: Allergies as of 09/30/2022 - Review Complete 09/04/2022  Allergen Reaction Noted   Antihistamines, chlorpheniramine-type Other (See Comments) 05/20/2022   Aspartame Other (See Comments) 04/09/2001   Lactose Other (See Comments) 05/28/2018   Lactose intolerance (gi) Other (See Comments) 05/28/2018   Methotrexate Other (See Comments) 05/20/2022   Nsaids Other (See Comments) 06/03/2018   Other Other (See Comments) 11/01/2017   Sucralose Other (See Comments) 10/05/2004   Sulfa antibiotics  11/01/2017   Sulfamethoxazole Other (See Comments) 05/20/2022   Zonisamide Other (See Comments) 04/09/2001   Hydroxychloroquine Other (See Comments), Rash, and Swelling 11/08/2016   Hydroxychloroquine sulfate Hives and Rash 10/13/2017   Lisinopril Swelling, Hives, and Rash 06/18/2004  Medications: Current Meds  Medication Sig   acetaminophen (TYLENOL) 325 MG tablet Take 975 mg by mouth 2 (two) times daily as needed (for pain.).   amLODipine (NORVASC) 10 MG tablet Take 10 mg by mouth at bedtime.   atorvastatin (LIPITOR) 20 MG tablet Take 10 mg by  mouth at bedtime.   candesartan (ATACAND) 32 MG tablet Take 32 mg by mouth every morning.    diclofenac sodium (VOLTAREN) 1 % GEL Apply 2-4 g topically 4 (four) times daily as needed (PAIN.).   diltiazem (TIAZAC) 360 MG 24 hr capsule Take 360 mg by mouth every morning.    fluticasone (FLONASE) 50 MCG/ACT nasal spray Place 2 sprays into both nostrils at bedtime.   gabapentin (NEURONTIN) 300 MG capsule Take 300 mg by mouth at bedtime.   ipratropium (ATROVENT) 0.03 % nasal spray Place 2 sprays into both nostrils at bedtime.   methocarbamol (ROBAXIN) 750 MG tablet Take 750 mg by mouth at bedtime as needed.   STOPAIN ROLL-ON 8 % LIQD Apply 1 application topically 3 (three) times daily as needed (for pain.).   terazosin (HYTRIN) 5 MG capsule Take 5 mg by mouth at bedtime.   tetrahydrozoline (VISINE) 0.05 % ophthalmic solution Place 1 drop into both eyes 3 (three) times daily as needed (for dry eyes).   tiZANidine (ZANAFLEX) 4 MG capsule Take 4 mg by mouth 2 (two) times daily as needed for muscle spasms.   traMADol (ULTRAM) 50 MG tablet Take 50 mg by mouth every 6 (six) hours as needed.    Social History: Social History   Tobacco Use   Smoking status: Former    Packs/day: 0.50    Years: 15.00    Total pack years: 7.50    Types: Cigarettes    Quit date: 06/03/1994    Years since quitting: 28.4   Smokeless tobacco: Never  Vaping Use   Vaping Use: Never used  Substance Use Topics   Alcohol use: Not Currently    Comment: rarely   Drug use: Never    Family Medical History: History reviewed. No pertinent family history.  Physical Examination: Vitals:   11/04/22 0827  BP: (!) 169/75  Pulse: 66  Resp: 15  Temp: 97.6 F (36.4 C)  SpO2: 97%   Heart sounds normal no MRG. Chest Clear to Auscultation Bilaterally.  General: Patient is well developed, well nourished, calm, collected, and in no apparent distress. Attention to examination is appropriate.  Neck:   Supple.  Full range of  motion.  Respiratory: Patient is breathing without any difficulty.   NEUROLOGICAL:     Awake, alert, oriented to person, place, and time.  Speech is clear and fluent. Fund of knowledge is appropriate.   Cranial Nerves: Pupils equal round and reactive to light.  Facial tone is symmetric.  Facial sensation is symmetric. Shoulder shrug is symmetric. Tongue protrusion is midline.  There is no pronator drift.  ROM of spine: full.    Strength: Side Biceps Triceps Deltoid Interossei Grip Wrist Ext. Wrist Flex.  R 5 4 5 5 5 5 5  $ L 5 5 5 5 5 5 5   $ Side Iliopsoas Quads Hamstring PF DF EHL  R 5 5 5 5 5 5  $ L 5 5 5 5 5 5   $ Reflexes are 3+ and symmetric at the biceps, triceps, brachioradialis, patella and achilles.   Hoffman's is present on the right.   Bilateral upper and lower extremity sensation is intact to light touch with exception of  right arm, which has diminished light touch.    No evidence of dysmetria noted.  Gait is wide-based and abnormal.  He has moderate difficulty with tandem gait..     Medical Decision Making  Imaging: MRI cervical spine shows multilevel degenerative disc disease and facet arthrosis.  This is most notable for severe spinal canal and severe bilateral neuroforaminal stenosis at C3-4 and C4-5.  There is severe bilateral neuroforaminal stenosis at C5-6.  There is mild to moderate bilateral neuroforaminal stenosis and mild spinal canal narrowing at C6-7.  There is abnormal increased T2 weighted signal intensity in the severely compressed spinal cord at C3-4 and C4-5 that probably represents myelomalacia and/or cord edema.  There is an incompletely imaged cyst like lesion just inferior to the hyoid bone.  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  Recommend CT of the neck if further evaluation is warranted.   I have personally reviewed the images and agree with the above interpretation.  Assessment and Plan: Alejandro Mendoza is a pleasant 67 y.o.  male with cervical myelopathy, cervical stenosis at C3-4 and C4-5, and cervical radiculopathy due to C5-6 foraminal stenosis.  He has critical stenosis at C3-4 and C4-5 including behind the C4 vertebral body.  He has myelomalacia between C3 and C5.  He has clinical myelopathy with hyperreflexia and difficulty with walking due to his balance.  There is no role for conservative management for this condition.  We will proceed with C4 corpectomy and C5-6 anterior cervical discectomy with C3-6 anterior cervical plating.    Mayzee Reichenbach K. Izora Ribas MD, [facility] Neurosurgery

## 2022-11-04 NOTE — Anesthesia Preprocedure Evaluation (Signed)
Anesthesia Evaluation  Patient identified by MRN, date of birth, ID band Patient awake    Reviewed: Allergy & Precautions, H&P , NPO status , Patient's Chart, lab work & pertinent test results, reviewed documented beta blocker date and time   History of Anesthesia Complications (+) history of anesthetic complications  Airway Mallampati: I  TM Distance: >3 FB Neck ROM: full    Dental  (+) Caps, Dental Advidsory Given, Chipped   Pulmonary neg shortness of breath, asthma , neg sleep apnea, neg COPD, neg recent URI, former smoker   Pulmonary exam normal breath sounds clear to auscultation       Cardiovascular Exercise Tolerance: Good hypertension, (-) angina (-) Past MI and (-) Cardiac Stents Normal cardiovascular exam(-) dysrhythmias (-) Valvular Problems/Murmurs Rhythm:regular Rate:Normal     Neuro/Psych negative neurological ROS  negative psych ROS   GI/Hepatic negative GI ROS, Neg liver ROS,,,  Endo/Other  diabetes, Well Controlled    Renal/GU negative Renal ROS  negative genitourinary   Musculoskeletal   Abdominal   Peds  Hematology negative hematology ROS (+)   Anesthesia Other Findings Past Medical History: No date: Arthritis No date: Asthma     Comment:  ENVIRONMENTAL IN 2000-ALBUTEROL PRN No date: Collagen vascular disease ([facility]) No date: Complication of anesthesia     Comment:  SEVERE HEARTBURN No date: Diabetes ([facility]) 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, 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 No date: Hyperlipidemia No date: Hypertension No date: Inflammatory arthritis No date: Neuropathy No date: Slipped disc in neck   Reproductive/Obstetrics negative OB ROS                              Anesthesia Physical Anesthesia Plan  ASA: 3  Anesthesia Plan: General   Post-op Pain Management:    Induction: Intravenous  PONV Risk Score and Plan: 2 and Ondansetron, Dexamethasone and  Treatment may vary due to age or medical condition  Airway Management Planned: Oral ETT  Additional Equipment:   Intra-op Plan:   Post-operative Plan: Extubation in OR and Possible Post-op intubation/ventilation  Informed Consent: I have reviewed the patients History and Physical, chart, labs and discussed the procedure including the risks, benefits and alternatives for the proposed anesthesia with the patient or authorized representative who has indicated his/her understanding and acceptance.     Dental Advisory Given  Plan Discussed with: Anesthesiologist, CRNA and Surgeon  Anesthesia Plan Comments:          Anesthesia Quick Evaluation

## 2022-11-04 NOTE — Transfer of Care (Signed)
Immediate Anesthesia Transfer of Care Note  Patient: Alejandro Mendoza  Procedure(s) Performed: C3-6 INSTRUMENTATION, C4 CORPECTOMY, C5-6 ANTERIOR CERVICAL DISCECTOMY AND FUSION (Spine Cervical)  Patient Location: PACU  Anesthesia Type:General  Level of Consciousness: awake and drowsy  Airway & Oxygen Therapy: Patient Spontanous Breathing and Patient connected to face mask oxygen  Post-op Assessment: Report given to RN and Post -op Vital signs reviewed and stable  Post vital signs: stable  Last Vitals:  Vitals Value Taken Time  BP 146/84 11/04/22 1331  Temp    Pulse 66 11/04/22 1333  Resp 21 11/04/22 1333  SpO2 95 % 11/04/22 1333  Vitals shown include unvalidated device data.  Last Pain:  Vitals:   11/04/22 0827  TempSrc: Temporal  PainSc: 2          Complications: No notable events documented.

## 2022-11-05 ENCOUNTER — Encounter: Payer: Self-pay | Admitting: Neurosurgery

## 2022-11-05 MED ORDER — ACETAMINOPHEN 500 MG PO TABS
ORAL_TABLET | ORAL | Status: AC
  Filled 2022-11-05: qty 2

## 2022-11-05 MED ORDER — METHOCARBAMOL 500 MG PO TABS
ORAL_TABLET | ORAL | Status: AC
  Administered 2022-11-05: 500 mg via ORAL
  Filled 2022-11-05: qty 1

## 2022-11-05 MED ORDER — SENNA 8.6 MG PO TABS
ORAL_TABLET | ORAL | Status: AC
  Administered 2022-11-05: 8.6 mg via ORAL
  Filled 2022-11-05: qty 1

## 2022-11-05 MED ORDER — METHOCARBAMOL 500 MG PO TABS: 500.0000 mg | ORAL_TABLET | Freq: Four times a day (QID) | ORAL | 0 refills | Status: DC | PRN

## 2022-11-05 MED ORDER — OXYCODONE HCL 5 MG PO TABS: 5.0000 mg | ORAL_TABLET | ORAL | 0 refills | Status: DC | PRN

## 2022-11-05 MED ORDER — OXYCODONE HCL 5 MG PO TABS
ORAL_TABLET | ORAL | Status: AC
  Filled 2022-11-05: qty 1

## 2022-11-05 MED ORDER — ACETAMINOPHEN 500 MG PO TABS
ORAL_TABLET | ORAL | Status: AC
  Administered 2022-11-05: 1000 mg via ORAL
  Filled 2022-11-05: qty 2

## 2022-11-05 MED ORDER — SENNA 8.6 MG PO TABS: 1.0000 | ORAL_TABLET | Freq: Every day | ORAL | 0 refills | Status: DC | PRN

## 2022-11-05 MED ORDER — ENOXAPARIN SODIUM 40 MG/0.4ML IJ SOSY
PREFILLED_SYRINGE | INTRAMUSCULAR | Status: AC
  Administered 2022-11-05: 40 mg via SUBCUTANEOUS
  Filled 2022-11-05: qty 0.4

## 2022-11-05 MED ORDER — DOCUSATE SODIUM 100 MG PO CAPS
ORAL_CAPSULE | ORAL | Status: AC
  Administered 2022-11-05: 100 mg via ORAL
  Filled 2022-11-05: qty 1

## 2022-11-05 NOTE — Discharge Summary (Signed)
Discharge Summary  Patient ID: [name] MRN: [id number] DOB/AGE: 1956-06-15 67 y.o.  Admit date: 11/04/2022 Discharge date: 11/05/2022  Admission Diagnoses: G95.9 Cervical myelopathy, M48.02 Cervical stenosis of spinal canal, M54.12 Cervical radiculopathy .  He also had presence of myelomalacia.   Discharge Diagnoses:  Principal Problem:   Cervical myelopathy ([place]) Active Problems:   Myelomalacia ([place])   Cervical spinal stenosis   Cervical radiculopathy   Discharged Condition: good  Hospital Course:  Alejandro Mendoza is a 67 y.o s/p C4 corpectomy. C5-6 ACDF, C3-6 anterior arthrodesis . His intraoperative course was uncomplicated and he was admitted for therapy evaluation and drain output monitoring. His drain output reduced and was removed on the afternoon of POD1. He was seen by therapy and deemed appropriate for discharge home with [facility]. He was discharged on POD1 with prescriptions for Oxycodone, Robaxin, and Senna.  Consults: None  Significant Diagnostic Studies: none   Treatments: surgery: as above. Please see separately dictated operative report for further details  Discharge Exam: Blood pressure (!) 161/77, pulse 70, temperature (!) 97.3 F (36.3 C), resp. rate 18, height 6' 3"$  (1.905 m), weight 117.9 kg, SpO2 95 %. A&Ox3 5/5 throughout BUE Incision c/d/I Trachea midline  Disposition: Discharge disposition: 06-Home-Health Care Svc       Discharge Instructions     Incentive spirometry RT   Complete by: As directed       Allergies as of 11/05/2022       Reactions   Antihistamines, Chlorpheniramine-type Other (See Comments)   Allergies to antihistamines: caused Saliva stones   Aspartame Other (See Comments)   Other Reaction(s): Feeling faint dizziness Other Reaction(s): FAINTNESS   Lactose Other (See Comments)   GI UPSET   Lactose Intolerance (gi) Other (See Comments)   GI UPSET   Methotrexate Other (See Comments)   Affected immune system   Nsaids  Other (See Comments)   GI BLEED with Motrin   Other Other (See Comments)   Allergy Test: Grass and Mold- runny nose, sneezing, red eyes   Sucralose Other (See Comments)   dizziness   Sulfa Antibiotics    Light headed, dizziness   Sulfamethoxazole Other (See Comments)   Light headedness and dizziness   Zonisamide Other (See Comments)   Member doesn't remember reaction   Hydroxychloroquine Other (See Comments), Rash, Swelling   Fever/"burning up" Other Reaction(s): Eruption of skin, Dizziness, Swelling, Eruption of skin, Dizziness, Swelling, Eruption of skin, Dizziness, Swelling   Hydroxychloroquine Sulfate Hives, Rash   Lisinopril Swelling, Hives, Rash   Facial swelling Facial swelling  Lip swelling        Medication List     STOP taking these medications    meloxicam 15 MG tablet Commonly known as: MOBIC   tiZANidine 4 MG capsule Commonly known as: ZANAFLEX   traMADol 50 MG tablet Commonly known as: ULTRAM       TAKE these medications    acetaminophen 325 MG tablet Commonly known as: TYLENOL Take 975 mg by mouth 2 (two) times daily as needed (for pain.).   amLODipine 10 MG tablet Commonly known as: NORVASC Take 10 mg by mouth at bedtime.   atorvastatin 20 MG tablet Commonly known as: LIPITOR Take 10 mg by mouth at bedtime.   candesartan 32 MG tablet Commonly known as: ATACAND Take 32 mg by mouth every morning.   diclofenac sodium 1 % Gel Commonly known as: VOLTAREN Apply 2-4 g topically 4 (four) times daily as needed (PAIN.).   diltiazem 360 MG  24 hr capsule Commonly known as: TIAZAC Take 360 mg by mouth every morning.   fluticasone 50 MCG/ACT nasal spray Commonly known as: FLONASE Place 2 sprays into both nostrils at bedtime.   gabapentin 300 MG capsule Commonly known as: NEURONTIN Take 300 mg by mouth at bedtime.   ipratropium 0.03 % nasal spray Commonly known as: ATROVENT Place 2 sprays into both nostrils at bedtime.   methocarbamol  500 MG tablet Commonly known as: ROBAXIN Take 1 tablet (500 mg total) by mouth every 6 (six) hours as needed for muscle spasms. What changed:  medication strength how much to take when to take this reasons to take this   OMEGA 3 PO Take 1,000 mg by mouth as needed.   oxyCODONE 5 MG immediate release tablet Commonly known as: Oxy IR/ROXICODONE Take 1 tablet (5 mg total) by mouth every 3 (three) hours as needed for moderate pain ((score 4 to 6)).   PreserVision AREDS 2 Caps Take 1 capsule by mouth 2 (two) times daily.   senna 8.6 MG Tabs tablet Commonly known as: SENOKOT Take 1 tablet (8.6 mg total) by mouth daily as needed for mild constipation.   Stopain Roll-On 8 % Liqd Generic drug: Menthol (Topical Analgesic) Apply 1 application topically 3 (three) times daily as needed (for pain.).   terazosin 5 MG capsule Commonly known as: HYTRIN Take 5 mg by mouth at bedtime.   Visine 0.05 % ophthalmic solution Generic drug: tetrahydrozoline Place 1 drop into both eyes 3 (three) times daily as needed (for dry eyes).               Durable Medical Equipment  (From admission, onward)           Start     Ordered   11/05/22 1039  For home use only DME Bedside commode  Once       Question:  Patient needs a bedside commode to treat with the following condition  Answer:  Impaired mobility   11/05/22 1039            Follow-up Information     Loleta Dicker, PA Follow up on 11/19/2022.   Specialty: Neurosurgery Contact information: [address] [place] [address] [phone]                 Signed: Loleta Dicker 11/05/2022, 3:18 PM

## 2022-11-05 NOTE — Evaluation (Signed)
Physical Therapy Evaluation Patient Details Name: Alejandro Mendoza MRN: [id number] DOB: 1955-11-14 Today's Date: 11/05/2022  History of Present Illness  Alejandro Mendoza is s/p C4 corpectomy. C5-6 ACDF, C3-6 anterior arthrodesis.  Clinical Impression  Pt did well with PT session POD1, still having some mild cervical/throat pain (as well as mild exacerbation of chronic low back/L sciatic pain) but was very functional with mobility and easily ambulated >300 ft, most of which was w/o AD.  No LOBs or overt safety issues with the effort, pt showed good understanding of precautions and limitations post cervical sx, all questions answered.  Safe to d/c home from a PT stand-point once medically cleared for d/c.       Recommendations for follow up therapy are one component of a multi-disciplinary discharge planning process, led by the attending physician.  Recommendations may be updated based on patient status, additional functional criteria and insurance authorization.  Follow Up Recommendations Follow physician's recommendations for discharge plan and follow up therapies      Assistance Recommended at Discharge Intermittent Supervision/Assistance  Patient can return home with the following  Assistance with cooking/housework;Assist for transportation    Equipment Recommendations None recommended by PT  Recommendations for Other Services       Functional Status Assessment Patient has had a recent decline in their functional status and demonstrates the ability to make significant improvements in function in a reasonable and predictable amount of time.     Precautions / Restrictions Precautions Precautions: Cervical Required Braces or Orthoses: Cervical Brace Cervical Brace: Hard collar;Other (comment) Restrictions Weight Bearing Restrictions: No Other Position/Activity Restrictions: [place] J collar donned in sitting and on when ambulating      Mobility  Bed Mobility                General bed mobility comments: seated in recliner chair at beginning/end of session    Transfers Overall transfer level: Modified independent Equipment used: Rolling walker (2 wheels) Transfers: Sit to/from Stand Sit to Stand: Supervision           General transfer comment: minimal cuing for set up, able to rise w/o assist    Ambulation/Gait Ambulation/Gait assistance: Supervision Gait Distance (Feet): 350 Feet Assistive device: Rolling walker (2 wheels), None         General Gait Details: ~150 ft with walker and ~200 ft w/o walker.  He had an expected amount of hesitancy but was able to assume consistent and safe cadence - O2 remained in the high 90s on room air, no increased pain and safety concerns with prolonged walk  Stairs            Wheelchair Mobility    Modified Rankin (Stroke Patients Only)       Balance Overall balance assessment: Needs assistance Sitting-balance support: Feet supported Sitting balance-Leahy Scale: Good     Standing balance support: During functional activity Standing balance-Leahy Scale: Fair                               Pertinent Vitals/Pain Pain Assessment Pain Assessment: 0-10 Pain Score: 2  Pain Location: cervical    Home Living Family/patient expects to be discharged to:: Private residence Living Arrangements: Spouse/significant other Available Help at Discharge: Family Type of Home: Apartment Home Access: Level entry       Home Layout: One level Home Equipment: Rollator (4 wheels) Additional Comments: 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    Prior Function Prior Level of Function : Independent/Modified Independent;Driving                     Hand Dominance   Dominant Hand: Right    Extremity/Trunk Assessment   Upper Extremity Assessment Upper Extremity Assessment: Overall WFL for tasks assessed    Lower Extremity Assessment Lower Extremity  Assessment: Overall WFL for tasks assessed       Communication   Communication: No difficulties  Cognition Arousal/Alertness: Awake/alert Behavior During Therapy: WFL for tasks assessed/performed Overall Cognitive Status: Within Functional Limits for tasks assessed                                          General Comments General comments (skin integrity, edema, etc.): Pt was able to move well and with confidence, donned collar with only modicum of cuing    Exercises     Assessment/Plan    PT Assessment Patient needs continued PT services  PT Problem List Decreased strength;Decreased activity tolerance;Decreased mobility;Decreased safety awareness;Decreased knowledge of use of DME;Decreased knowledge of precautions;Pain       PT Treatment Interventions DME instruction;Gait training;Functional mobility training;Therapeutic activities;Stair training;Therapeutic exercise;Balance training;Neuromuscular re-education;Patient/family education    PT Goals (Current goals can be found in the Care Plan section)  Acute Rehab PT Goals Patient Stated Goal: go home PT Goal Formulation: With patient Time For Goal Achievement: 11/18/22 Potential to Achieve Goals: Good    Frequency 7X/week     Co-evaluation               AM-PAC PT "6 Clicks" Mobility  Outcome Measure Help needed turning from your back to your side while in a flat bed without using bedrails?: None Help needed moving from lying on your back to sitting on the side of a flat bed without using bedrails?: None Help needed moving to and from a bed to a chair (including a wheelchair)?: None Help needed standing up from a chair using your arms (e.g., wheelchair or bedside chair)?: None Help needed to walk in hospital room?: None Help needed climbing 3-5 steps with a railing? : None 6 Click Score: 24    End of Session Equipment Utilized During Treatment: Cervical collar Activity Tolerance: Patient  tolerated treatment well Patient left: with call bell/phone within reach;in chair Nurse Communication: Mobility status PT Visit Diagnosis: Muscle weakness (generalized) (M62.81);Pain Pain - part of body:  (cervicalgia)    Time: [id number] PT Time Calculation (min) (ACUTE ONLY): 20 min   Charges:   PT Evaluation $PT Eval Low Complexity: 1 Low PT Treatments $Gait Training: 8-22 mins        Kreg Shropshire, DPT 11/05/2022, 1:42 PM

## 2022-11-05 NOTE — Discharge Instructions (Addendum)
Your surgeon has performed an operation on your cervical spine (neck) to relieve pressure on the spinal cord and/or nerves. This involved making an incision in the front of your neck and removing one or more of the discs that support your spine. Next, a small piece of bone, a titanium plate, and screws were used to fuse two or more of the vertebrae (bones) together.  The following are instructions to help in your recovery once you have been discharged from the hospital. Even if you feel well, it is important that you follow these activity guidelines. If you do not let your neck heal properly from the surgery, you can increase the chance of return of your symptoms and other complications.  * Do not take anti-inflammatory medications for 3 months after surgery (naproxen [Aleve], ibuprofen [Advil, Motrin], etc.). These medications can prevent your bones from healing properly.  Celebrex, if prescribed, is ok to take.  Activity    No bending, lifting, or twisting ("BLT"). Avoid lifting objects heavier than 10 pounds (gallon milk jug).  Where possible, avoid household activities that involve lifting, bending, reaching, pushing, or pulling such as laundry, vacuuming, grocery shopping, and childcare. Try to arrange for help from friends and family for these activities while your back heals.  Increase physical activity slowly as tolerated.  Taking short walks is encouraged, but avoid strenuous exercise. Do not jog, run, bicycle, lift weights, or participate in any other exercises unless specifically allowed by your doctor.  Talk to your doctor before resuming sexual activity.  You should not drive until cleared by your doctor.  Until released by your doctor, you should not return to work or school.  You should rest at home and let your body heal.   You may shower three days after your surgery.  After showering, lightly dab your incision dry. Do not take a tub bath or go swimming until approved by your  doctor at your follow-up appointment.  If your doctor ordered a cervical collar (neck brace) for you, you should wear it whenever you are out of bed. You may remove it when lying down or sleeping, but you should wear it at all other times. Not all neck surgeries require a cervical collar.  If you smoke, we strongly recommend that you quit.  Smoking has been proven to interfere with normal bone healing and will dramatically reduce the success rate of your surgery. Please contact [facility] (800-QUIT-NOW) and use the resources at www.[facility].com for assistance in stopping smoking.  Surgical Incision   If you have a dressing on your incision, you may remove it two days after your surgery. Keep your incision area clean and dry.  Your incision was closed with Dermabond glue. glue should begin to peel away within about a week Diet           You may return to your usual diet. However, you may experience discomfort when swallowing in the first month after your surgery. This is normal. You may find that softer foods are more comfortable for you to swallow. Be sure to stay hydrated.  When to Contact us  You may experience pain in your neck and/or pain between your shoulder blades. This is normal and should improve in the next few weeks with the help of pain medication, muscle relaxers, and rest. Some patients report that a warm compress on the back of the neck or between the shoulder blades helps.  However, should you experience any of the following, contact us immediately: New  numbness or weakness Pain that is progressively getting worse, and is not relieved by your pain medication, muscle relaxers, rest, and warm compresses Bleeding, redness, swelling, pain, or drainage from surgical incision Chills or flu-like symptoms Fever greater than 101.0 F (38.3 C) Inability to eat, drink fluids, or take medications Problems with bowel or bladder functions Difficulty breathing or shortness of  breath Warmth, tenderness, or swelling in your calf Contact Information During office hours (Monday-Friday 9 am to 5 pm), please call your physician at [phone] and ask for Berdine Addison After hours and weekends, please call [phone] and speak with the neurosurgeon on call For a life-threatening emergency, call 911

## 2022-11-05 NOTE — Evaluation (Signed)
Occupational Therapy Evaluation Patient Details Name: Alejandro Mendoza MRN: [id number] DOB: 1955-12-25 Today's Date: 11/05/2022   History of Present Illness Alejandro Mendoza is s/p C4 corpectomy. C5-6 ACDF, C3-6 anterior arthrodesis.   Clinical Impression   Upon entering the room, pt seated in recliner chair and agreeable to OT intervention. Pt reports living at home with significant other in an apartment. Pt and his wife are both retired and she is able to assist as needed at discharge. Per pt he  ambulates without AD at baseline and is ind in all aspects of care. OT educated pt on cervical precautions, sling use, and how to don/doff sling. Pt reports no longer having numbness in B UEs after surgery and he was able to pull on velcro tab to adjust his hard collar during session. Pt utilized figure four position to thread clothing onto B feet and stands with supervision to pull clothing over B hips. Pt ambulates 200' with RW and supervision progressing to mod I and then returns to room. Pt seated in recliner chair with call bell and all needed items within reach. Pt does not need further acute OT intervention. OT to complete orders.      Recommendations for follow up therapy are one component of a multi-disciplinary discharge planning process, led by the attending physician.  Recommendations may be updated based on patient status, additional functional criteria and insurance authorization.   Follow Up Recommendations  No OT follow up     Assistance Recommended at Discharge Intermittent Supervision/Assistance  Patient can return home with the following A little help with bathing/dressing/bathroom;Assist for transportation;Assistance with cooking/housework       Equipment Recommendations  None recommended by OT       Precautions / Restrictions Precautions Precautions: Cervical Required Braces or Orthoses: Cervical Brace Cervical Brace: Hard collar;Other (comment) (donned in sitting and on  when ambulating) Restrictions Other Position/Activity Restrictions: [place] J collar donned in sitting and on when ambulating      Mobility Bed Mobility               General bed mobility comments: seated in recliner chair at beginning/end of session    Transfers Overall transfer level: Needs assistance Equipment used: Rolling walker (2 wheels) Transfers: Sit to/from Stand, Bed to chair/wheelchair/BSC Sit to Stand: Supervision     Step pivot transfers: Supervision            Balance Overall balance assessment: Needs assistance Sitting-balance support: Feet supported Sitting balance-Leahy Scale: Good     Standing balance support: Reliant on assistive device for balance, During functional activity, Bilateral upper extremity supported Standing balance-Leahy Scale: Fair                             ADL either performed or assessed with clinical judgement   ADL Overall ADL's : Needs assistance/impaired                     Lower Body Dressing: Min guard;Sit to/from stand Lower Body Dressing Details (indicate cue type and reason): use of figure four position to thread clothing onto B feet                     Vision Patient Visual Report: No change from baseline              Pertinent Vitals/Pain Pain Assessment Pain Assessment: 0-10 Pain Score: 4  Pain Location: lumbar back  Pain Descriptors / Indicators: Aching, Discomfort Pain Intervention(s): Monitored during session, Repositioned     Hand Dominance Right   Extremity/Trunk Assessment Upper Extremity Assessment Upper Extremity Assessment: Overall WFL for tasks assessed           Communication Communication Communication: No difficulties   Cognition Arousal/Alertness: Awake/alert Behavior During Therapy: WFL for tasks assessed/performed Overall Cognitive Status: Within Functional Limits for tasks assessed                                                   Home Living Family/patient expects to be discharged to:: Private residence Living Arrangements: Spouse/significant other Available Help at Discharge: Family Type of Home: Apartment Home Access: Level entry     Home Layout: One level     Bathroom Shower/Tub: Teacher, early years/pre: Standard     Home Equipment: Rollator (4 wheels)   Additional Comments: 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      Prior Functioning/Environment Prior Level of Function : Independent/Modified Independent;Driving                                 OT Goals(Current goals can be found in the care plan section) Acute Rehab OT Goals Patient Stated Goal: to go home OT Goal Formulation: With patient Time For Goal Achievement: 11/05/22 Potential to Achieve Goals: Good  OT Frequency:         AM-PAC OT "6 Clicks" Daily Activity     Outcome Measure Help from another person eating meals?: None Help from another person taking care of personal grooming?: None Help from another person toileting, which includes using toliet, bedpan, or urinal?: None Help from another person bathing (including washing, rinsing, drying)?: None Help from another person to put on and taking off regular upper body clothing?: None Help from another person to put on and taking off regular lower body clothing?: A Little 6 Click Score: 23   End of Session Equipment Utilized During Treatment: Rolling walker (2 wheels) Nurse Communication: Mobility status  Activity Tolerance: Patient tolerated treatment well Patient left: in bed;with call bell/phone within reach;with bed alarm set                   Time: [id number] OT Time Calculation (min): 23 min Charges:  OT General Charges $OT Visit: 1 Visit OT Evaluation $OT Eval Low Complexity: 1 Low OT Treatments $Self Care/Home Management : 8-22 mins Darleen Crocker, MS, OTR/L , CBIS ascom [phone]  11/05/22, 12:44 PM

## 2022-11-05 NOTE — Progress Notes (Signed)
Patient is not able to walk the distance required to go the bathroom, or he/she is unable to safely negotiate stairs required to access the bathroom.  A 3in1 BSC will alleviate this problem  

## 2022-11-05 NOTE — Progress Notes (Signed)
    Attending Progress Note  History: Alejandro Mendoza is s/p C4 corpectomy. C5-6 ACDF, C3-6 anterior arthrodesis.   Physical Exam: Vitals:   11/05/22 0457 11/05/22 0806  BP: (!) 152/74 (!) 162/72  Pulse: 74 70  Resp: 16 18  Temp: 98.7 F (37.1 C) 97.9 F (36.6 C)  SpO2: 97% 95%    AA Ox3 CNI  Strength:5/5 throughout BUE JP output 30 this morning 50 overnight  Data:  Other tests/results: none  Assessment/Plan:  Alejandro Mendoza is a 67 y.o presenting with cervical myelopathy and cervical stenosis s/p C4 corpectomy. C5-6 ACDF, C3-6 anterior arthrodesis.  On 11/04/22.   - mobilize - pain control - DVT prophylaxis - continue drain. Will reconsider removal this afternoon - PTOT; pt set up with [facility] pre-op  Cooper Render PA-C Department of Neurosurgery

## 2022-11-08 ENCOUNTER — Telehealth: Payer: Self-pay

## 2022-11-08 DIAGNOSIS — Z981 Arthrodesis status: Secondary | ICD-10-CM

## 2022-11-08 DIAGNOSIS — G959 Disease of spinal cord, unspecified: Secondary | ICD-10-CM

## 2022-11-08 MED ORDER — OXYCODONE HCL 5 MG PO TABS: 5.0000 mg | ORAL_TABLET | ORAL | 0 refills | Status: DC | PRN

## 2022-11-08 NOTE — Telephone Encounter (Signed)
Patient notified of refill.

## 2022-11-08 NOTE — Telephone Encounter (Signed)
He is s/p C4 corpectomy. C5-6 ACDF, C3-6 anterior arthrodesis on 11/05/22.   PMP reviewed and is appropriate.   Refill of oxycodone sent to pharmacy.   Please let him know.

## 2022-11-08 NOTE — Telephone Encounter (Signed)
Refill request Received: Today Ray, Jerry Caras, RN; Rae Lips, CMA Has about 14 tablets left Is looking for a small refill to get him by a few more days next week  oxyCODONE (OXY IR/ROXICODONE) 5 MG immediate release tablet  [facility] [address] Rabon  CB: [phone]

## 2022-11-14 ENCOUNTER — Telehealth: Payer: Self-pay

## 2022-11-14 DIAGNOSIS — G959 Disease of spinal cord, unspecified: Secondary | ICD-10-CM

## 2022-11-14 DIAGNOSIS — Z981 Arthrodesis status: Secondary | ICD-10-CM

## 2022-11-14 MED ORDER — OXYCODONE HCL 5 MG PO TABS: 5.0000 mg | ORAL_TABLET | ORAL | 0 refills | Status: DC | PRN

## 2022-11-14 NOTE — Telephone Encounter (Signed)
-----   Message from [facility] sent at 11/14/2022 11:21 AM EST ----- Regarding: postop med refill Contact: [phone] C3-6 instrumentation, C4 corpectomy, C5-6 ACDF on 11/04/22 Oxycodone 19m every 4 hours [facility] [address] Pre-op 11/19/22

## 2022-11-14 NOTE — Telephone Encounter (Signed)
C3-6 instrumentation, C4 corpectomy, C5-6 ACDF on 11/04/22   Was given 5 day supply of oxycodone on 11/08/22. PMP reviewed and is appropriate.   Refill of oxycodone sent to pharmacy. Please let him know.   Will keep directions the same for now. May go to q 6 hours at next refill.

## 2022-11-14 NOTE — Telephone Encounter (Signed)
Patient notified of med refill and directions.

## 2022-11-14 NOTE — Anesthesia Postprocedure Evaluation (Signed)
Anesthesia Post Note  Patient: Alejandro Mendoza  Procedure(s) Performed: C3-6 INSTRUMENTATION, C4 CORPECTOMY, C5-6 ANTERIOR CERVICAL DISCECTOMY AND FUSION (Spine Cervical)  Patient location during evaluation: PACU Anesthesia Type: General Level of consciousness: awake and alert Pain management: pain level controlled Vital Signs Assessment: post-procedure vital signs reviewed and stable Respiratory status: spontaneous breathing, nonlabored ventilation, respiratory function stable and patient connected to nasal cannula oxygen Cardiovascular status: blood pressure returned to baseline and stable Postop Assessment: no apparent nausea or vomiting Anesthetic complications: no   No notable events documented.   Last Vitals:  Vitals:   11/05/22 0806 11/05/22 1240  BP: (!) 162/72 (!) 161/77  Pulse: 70   Resp: 18 18  Temp: 36.6 C (!) 36.3 C  SpO2: 95% 95%    Last Pain:  Vitals:   11/05/22 1203  TempSrc:   PainSc: 1                  Martha Clan

## 2022-11-15 ENCOUNTER — Telehealth: Payer: Self-pay

## 2022-11-15 NOTE — Telephone Encounter (Signed)
I was able to reach Alejandro Mendoza on an alternate number. I discussed our recommendations.  He reports feeling that his symptoms were related to acid reflux/indigestion and his symptoms have resolved. I reiterated that in the setting of chest pain, we always recommend the ER and advised that if his symptoms return, he should be evaluated.

## 2022-11-15 NOTE — Telephone Encounter (Signed)
-----   Message from Peggyann Shoals sent at 11/15/2022  2:12 PM EST ----- Regarding: FYI Contact: [phone] C3-6 instrumentation, C4 corpectomy, C5-6 ACDF on 11/04/22 [facility] from [place] Patient reported chest pain but when he pointed to the pain, it's more in his sternum. He felt like he wanted to burp. He has been gassy and burping a lot. Pain is 1-2 out of 10.  He has noticed his stools are watery today. Vital signs are good. Mary just waned Dr.Yarbrough to know.

## 2022-11-15 NOTE — Telephone Encounter (Signed)
Left message on Mary's voicemail that our PA Stacy recommends ER if he is having chest pain and discussing other symptoms with his PCP. I left the same message on Mr [employer].

## 2022-11-19 ENCOUNTER — Encounter: Payer: Self-pay | Admitting: Neurosurgery

## 2022-11-19 ENCOUNTER — Ambulatory Visit (INDEPENDENT_AMBULATORY_CARE_PROVIDER_SITE_OTHER): Payer: Medicare Other | Admitting: Neurosurgery

## 2022-11-19 VITALS — BP 138/76 | Temp 98.0°F | Ht 75.0 in | Wt 260.0 lb

## 2022-11-19 DIAGNOSIS — Z981 Arthrodesis status: Secondary | ICD-10-CM

## 2022-11-19 DIAGNOSIS — M5412 Radiculopathy, cervical region: Secondary | ICD-10-CM

## 2022-11-19 DIAGNOSIS — Z09 Encounter for follow-up examination after completed treatment for conditions other than malignant neoplasm: Secondary | ICD-10-CM

## 2022-11-19 DIAGNOSIS — M4802 Spinal stenosis, cervical region: Secondary | ICD-10-CM

## 2022-11-19 DIAGNOSIS — G959 Disease of spinal cord, unspecified: Secondary | ICD-10-CM

## 2022-11-19 MED ORDER — OXYCODONE HCL 5 MG PO TABS: 5.0000 mg | ORAL_TABLET | Freq: Three times a day (TID) | ORAL | 0 refills | Status: AC | PRN

## 2022-11-19 NOTE — Progress Notes (Signed)
   REFERRING PHYSICIAN:  Baxter Hire, [address] [place],  [place] [address]  DOS: 11/04/22 C4 corpectomy, C5-6 ACDF, C3-6 instrumentation  HISTORY OF PRESENT ILLNESS: Alejandro Mendoza is about 2 weeks status post cervical fusion. Overall, he is doing well postoperatively.  He has had some difficulty with swallowing but this is improving slowly.  He also continues to have numbness in his thumb and pointer finger on the right hand.  Overall he is pleased with his postoperative recovery thus far.  He continues to require oxycodone about twice a day.  He has recently been discharged from home physical therapy.  PHYSICAL EXAMINATION:  NEUROLOGICAL:  General: In no acute distress.   Awake, alert, oriented to person, place, and time.  Pupils equal round and reactive to light.  Facial tone is symmetric.  Tongue protrusion is midline.  There is no pronator drift.  Strength: Side Biceps Triceps Deltoid Interossei Grip Wrist Ext. Wrist Flex.  R 5 5 5 5 5 5 5  $ L 5 5 5 5 5 5 5   $ Incision c/d/I and healing well  Imaging:  No interval imaging to review  Assessment / Plan: Alejandro Mendoza is doing well after anterior cervical.  I will refill his oxycodone as requested.  I will also place a referral to [facility] physical therapy as requested and instructed the patient to call them to schedule.  We discussed activity escalation and I have advised the patient to lift up to 10 pounds until 6 weeks after surgery, then increase up to 25 pounds until 12 weeks after surgery.  After 12 weeks post-op, the patient advised to increase activity as tolerated. he will return to clinic in approximately 4 weeks with cervical x-rays prior.    Advised to contact the office if any questions or concerns arise.   Cooper Render PA-C Dept of Neurosurgery

## 2022-11-28 NOTE — Therapy (Signed)
OUTPATIENT PHYSICAL THERAPY CERVICAL EVALUATION   Patient Name: Alejandro Mendoza MRN: [id number] DOB:[birth year], 67 y.o., male Today's Date: 12/02/2022  END OF SESSION:  PT End of Session - 12/02/22 1127     Visit Number 1    Number of Visits 16    Date for PT Re-Evaluation 01/27/23    Authorization Type 1/10 eval 12/02/22    PT Start Time 1100    PT Stop Time 1135    PT Time Calculation (min) 35 min    Equipment Utilized During Treatment Cervical collar    Activity Tolerance Patient tolerated treatment well    Behavior During Therapy WFL for tasks assessed/performed             Past Medical History:  Diagnosis Date   Arthritis    Asthma    ENVIRONMENTAL IN 2000-ALBUTEROL PRN   Collagen vascular disease ([place])    Complication of anesthesia    SEVERE HEARTBURN   Diabetes ([place])    Disc displacement, lumbar    SLIPPED DISC L3-4   Hyperlipidemia    Hypertension    Inflammatory arthritis    Neuropathy    Slipped disc in neck    Past Surgical History:  Procedure Laterality Date   ANTERIOR CERVICAL DECOMP/DISCECTOMY FUSION N/A 11/04/2022   Procedure: C3-6 INSTRUMENTATION, C4 CORPECTOMY, C5-6 ANTERIOR CERVICAL DISCECTOMY AND FUSION;  Surgeon: Meade Maw, MD;  Location: [facility] ORS;  Service: Neurosurgery;  Laterality: N/A;   COLONOSCOPY  2019   CONTINUOUS NERVE MONITORING N/A 06/10/2018   Procedure: FACIAL NERVE MONITORING;  Surgeon: Clyde Canterbury, MD;  Location: [facility] ORS;  Service: ENT;  Laterality: N/A;   [place] INJECTION     SALIVARY STONE REMOVAL Left 06/10/2018   Procedure: SALIVARY STONE REMOVAL/SIALODUCOPLASTY;  Surgeon: Clyde Canterbury, MD;  Location: [facility] ORS;  Service: ENT;  Laterality: Left;   SUBMANDIBULAR GLAND EXCISION Left 06/10/2018   Procedure: EXCISION SUBMANDIBULAR GLAND;  Surgeon: Clyde Canterbury, MD;  Location: [facility] ORS;  Service: ENT;  Laterality: Left;   Patient Active Problem List   Diagnosis  Date Noted   Cervical myelopathy ([place]) 11/04/2022   Myelomalacia ([place]) 11/04/2022   Cervical spinal stenosis 11/04/2022   Cervical radiculopathy 11/04/2022    PCP: Baxter Hire MD  REFERRING PROVIDER: Loleta Dicker PA  REFERRING DIAG: s/p ACDF, cervical myelopathy  THERAPY DIAG:  Cervicalgia  Abnormal posture  Rationale for Evaluation and Treatment: Rehabilitation  ONSET DATE: 11/04/22  SUBJECTIVE:  SUBJECTIVE STATEMENT: Patient presents to PT s/p ACDF on 11/04/22.  PERTINENT HISTORY:  Patient presents to PT for cervical myelopathy s/p ACDF on 11/04/22 for C3-6. Marland Kitchen Patient did have home health PT. Restrictions include not lifting >10 lb until 6 weeks post op; then increase to  25 lb after 12 weeks. Patient has to wear J collar for 6 weeks. Sleeping, eating, and sitting down he can take it off.  Prior to surgery patient underwent physical therapy at [facility]. Patient had numbness in index finger and thumb, then had the whole right arm go numb.  PMH arthritis, asthma, collagen vascular disease, diabetes, slipped disc L3-4, HLD, HTN, neuropathy. Active baseline, goes to the gym, plays bass guitar.    PAIN:  Are you having pain? Yes: NPRS scale: 1/10 Pain location: neck Pain description: stiffness, aching in the morning Aggravating factors: first waking up Relieving factors: Pain medicine   PRECAUTIONS: Cervical  WEIGHT BEARING RESTRICTIONS: Yes Restrictions include not lifting >10 lb until 6 weeks post op; then increase to  25 lb after 12 weeks.  FALLS:  Has patient fallen in last 6 months? No  LIVING ENVIRONMENT: Lives with: lives with their spouse Lives in: House/apartment Stairs: No Has following equipment at home: Single point cane and Environmental consultant - 2  wheeled  OCCUPATION: retired  PLOF: Independent  PATIENT GOALS: to get back to normal   NEXT MD VISIT: 12/17/22  OBJECTIVE:   DIAGNOSTIC FINDINGS:  IMPRESSION: 1. C4 corpectomy and C3-C6 anterior cervical fusion.  PATIENT SURVEYS:  NDI 40% FOTO 47%  COGNITION: Overall cognitive status: Within functional limits for tasks assessed  SENSATION: Light touch: Impaired ; slight difference on the R  POSTURE: rounded shoulders and forward head  PALPATION: Surgical scar anterior neck    CERVICAL ROM:   Active ROM   A/PROM (deg) Eval  12/02/22  Flexion 10  Extension 11  Right lateral flexion 5  Left lateral flexion 10  Right rotation 10  Left rotation 8   (Blank rows = not tested)  UPPER EXTREMITY ROM:  Active ROM Right eval Left eval  Shoulder flexion 83 708  Shoulder extension [facility] WFL  Shoulder abduction [facility]  Shoulder adduction     (Blank rows = not tested)  UPPER EXTREMITY MMT:  *deferred due to precautions   CERVICAL SPECIAL TESTS:  *deferred due to precaution    TODAY'S TREATMENT:                                                                                                                              DATE: 12/02/22    PATIENT EDUCATION:  Education details: goals, POC, HEP  Person educated: Patient Education method: Explanation, Demonstration, Tactile cues, Verbal cues, and Handouts Education comprehension: verbalized understanding, returned demonstration, verbal cues required, and tactile cues required  HOME EXERCISE PROGRAM: Access Code: [email or web address] URL: [email or web address] Date: 12/02/2022 Prepared by: Janna Arch  Exercises - Seated Scapular Retraction  - 1  x daily - 7 x weekly - 2 sets - 10 reps - 5 hold  ASSESSMENT:  CLINICAL IMPRESSION: Patient is a 67 y.o. male who was seen today for physical therapy evaluation and treatment for ACDF C3-6 on 11/04/22. Evaluation limited by continued precautions at this time.   Restrictions include not lifting >10 lb until 6 weeks post op; then increase to  25 lb after 12 weeks. Patient has to wear J collar for 6 weeks.  Patient educated on posture correction and need for ambulation between sessions. Patient's scar is healing and scabbing at this time, appears healthy.  Patient will benefit from skilled physical therapy to reduce pain, improve ROM, and return to PLOF.   OBJECTIVE IMPAIRMENTS: decreased activity tolerance, decreased mobility, decreased ROM, decreased strength, hypomobility, increased fascial restrictions, impaired flexibility, impaired sensation, impaired UE functional use, improper body mechanics, postural dysfunction, and pain.   ACTIVITY LIMITATIONS: carrying, lifting, bending, standing, squatting, sleeping, transfers, bed mobility, dressing, reach over head, hygiene/grooming, and caring for others  PARTICIPATION LIMITATIONS: meal prep, cleaning, laundry, driving, shopping, community activity, yard work, school, and church  PERSONAL FACTORS: Age, Time since onset of injury/illness/exacerbation, and 3+ comorbidities: arthritis, asthma, collagen vascular disease, diabetes, slipped disc L3-4, HLD, HTN, neuropathy  are also affecting patient's functional outcome.   REHAB POTENTIAL: Good  CLINICAL DECISION MAKING: Stable/uncomplicated  EVALUATION COMPLEXITY: Low   GOALS: Goals reviewed with patient? Yes  SHORT TERM GOALS: Target date: 12/16/2022    Patient will be independent in home exercise program to improve strength/mobility for better functional independence with ADLs. Baseline: 3/11: HEP given  Goal status: INITIAL    LONG TERM GOALS: Target date: 01/27/2023    Patient will increase FOTO score to equal to or greater than  56   to demonstrate statistically significant improvement in mobility and quality of life Baseline: 47% Goal status: INITIAL  2.  Patient will reduce Neck Disability Index score to <10% to demonstrate minimal  disability with ADL's including improved sleeping tolerance, sitting tolerance, etc for better mobility at home and work. Baseline: 3/11: 40% Goal status: INITIAL  3.  Patient will increase cervical ROM within 10 degrees of standard range to return to driving, functional movement.  Baseline: 3/11: see above Goal status: INITIAL  4.  Patient will return to full gym program without restrictions.  Baseline: 3/11: limited by restrictions Goal status: INITIAL   PLAN:  PT FREQUENCY: 2x/week  PT DURATION: 8 weeks  PLANNED INTERVENTIONS: Therapeutic exercises, Therapeutic activity, Neuromuscular re-education, Balance training, Gait training, Patient/Family education, Self Care, Joint mobilization, Stair training, Vestibular training, Canalith repositioning, DME instructions, Dry Needling, Electrical stimulation, Spinal mobilization, Cryotherapy, Moist heat, Compression bandaging, scar mobilization, Splintting, Taping, Ultrasound, Manual therapy, and Re-evaluation  PLAN FOR NEXT SESSION: cervical AROM in supine position, AAROM UE ROM, scar tissue massage   Janna Arch, PT 12/02/2022, 12:10 PM

## 2022-12-02 ENCOUNTER — Ambulatory Visit: Payer: Medicare Other | Attending: Neurosurgery

## 2022-12-02 DIAGNOSIS — Z981 Arthrodesis status: Secondary | ICD-10-CM | POA: Diagnosis not present

## 2022-12-02 DIAGNOSIS — R293 Abnormal posture: Secondary | ICD-10-CM | POA: Insufficient documentation

## 2022-12-02 DIAGNOSIS — M4802 Spinal stenosis, cervical region: Secondary | ICD-10-CM | POA: Insufficient documentation

## 2022-12-02 DIAGNOSIS — M542 Cervicalgia: Secondary | ICD-10-CM | POA: Insufficient documentation

## 2022-12-03 NOTE — Therapy (Signed)
OUTPATIENT PHYSICAL THERAPY CERVICAL TREATMENT   Patient Name: Alejandro Mendoza MRN: [id number] DOB:October 09, 1955, 67 y.o., male Today's Date: 12/04/2022  END OF SESSION:  PT End of Session - 12/04/22 1052     Visit Number 2    Number of Visits 16    Date for PT Re-Evaluation 01/27/23    Authorization Type 2/10 eval 12/02/22    PT Start Time 1100    PT Stop Time 1143    PT Time Calculation (min) 43 min    Equipment Utilized During Treatment Cervical collar    Activity Tolerance Patient tolerated treatment well    Behavior During Therapy WFL for tasks assessed/performed              Past Medical History:  Diagnosis Date   Arthritis    Asthma    ENVIRONMENTAL IN 2000-ALBUTEROL PRN   Collagen vascular disease ([place])    Complication of anesthesia    SEVERE HEARTBURN   Diabetes ([place])    Disc displacement, lumbar    SLIPPED DISC L3-4   Hyperlipidemia    Hypertension    Inflammatory arthritis    Neuropathy    Slipped disc in neck    Past Surgical History:  Procedure Laterality Date   ANTERIOR CERVICAL DECOMP/DISCECTOMY FUSION N/A 11/04/2022   Procedure: C3-6 INSTRUMENTATION, C4 CORPECTOMY, C5-6 ANTERIOR CERVICAL DISCECTOMY AND FUSION;  Surgeon: Meade Maw, MD;  Location: [facility] ORS;  Service: Neurosurgery;  Laterality: N/A;   COLONOSCOPY  2019   CONTINUOUS NERVE MONITORING N/A 06/10/2018   Procedure: FACIAL NERVE MONITORING;  Surgeon: Clyde Canterbury, MD;  Location: [facility] ORS;  Service: ENT;  Laterality: N/A;   [place] INJECTION     SALIVARY STONE REMOVAL Left 06/10/2018   Procedure: SALIVARY STONE REMOVAL/SIALODUCOPLASTY;  Surgeon: Clyde Canterbury, MD;  Location: [facility] ORS;  Service: ENT;  Laterality: Left;   SUBMANDIBULAR GLAND EXCISION Left 06/10/2018   Procedure: EXCISION SUBMANDIBULAR GLAND;  Surgeon: Clyde Canterbury, MD;  Location: [facility] ORS;  Service: ENT;  Laterality: Left;   Patient Active Problem List   Diagnosis  Date Noted   Cervical myelopathy ([place]) 11/04/2022   Myelomalacia ([place]) 11/04/2022   Cervical spinal stenosis 11/04/2022   Cervical radiculopathy 11/04/2022    PCP: Baxter Hire MD  REFERRING PROVIDER: Loleta Dicker PA  REFERRING DIAG: s/p ACDF, cervical myelopathy  THERAPY DIAG:  Cervicalgia  Abnormal posture  Rationale for Evaluation and Treatment: Rehabilitation  ONSET DATE: 11/04/22  SUBJECTIVE:  SUBJECTIVE STATEMENT: Patient reports no pain, has been compliant with HEP.   PERTINENT HISTORY:  Patient presents to PT for cervical myelopathy s/p ACDF on 11/04/22 for C3-6. Marland Kitchen Patient did have home health PT. Restrictions include not lifting >10 lb until 6 weeks post op; then increase to  25 lb after 12 weeks. Patient has to wear J collar for 6 weeks. Sleeping, eating, and sitting down he can take it off.  Prior to surgery patient underwent physical therapy at [facility]. Patient had numbness in index finger and thumb, then had the whole right arm go numb.  PMH arthritis, asthma, collagen vascular disease, diabetes, slipped disc L3-4, HLD, HTN, neuropathy. Active baseline, goes to the gym, plays bass guitar.    PAIN:  Are you having pain? Yes: NPRS scale: 1/10 Pain location: neck Pain description: stiffness, aching in the morning Aggravating factors: first waking up Relieving factors: Pain medicine   PRECAUTIONS: Cervical  WEIGHT BEARING RESTRICTIONS: Yes Restrictions include not lifting >10 lb until 6 weeks post op; then increase to  25 lb after 12 weeks.  FALLS:  Has patient fallen in last 6 months? No  LIVING ENVIRONMENT: Lives with: lives with their spouse Lives in: House/apartment Stairs: No Has following equipment at home: Single point cane and Environmental consultant - 2  wheeled  OCCUPATION: retired  PLOF: Independent  PATIENT GOALS: to get back to normal   NEXT MD VISIT: 12/17/22  OBJECTIVE:   DIAGNOSTIC FINDINGS:  IMPRESSION: 1. C4 corpectomy and C3-C6 anterior cervical fusion.  PATIENT SURVEYS:  NDI 40% FOTO 47%  COGNITION: Overall cognitive status: Within functional limits for tasks assessed  SENSATION: Light touch: Impaired ; slight difference on the R  POSTURE: rounded shoulders and forward head  PALPATION: Surgical scar anterior neck    CERVICAL ROM:   Active ROM   A/PROM (deg) Eval  12/02/22  Flexion 10  Extension 11  Right lateral flexion 5  Left lateral flexion 10  Right rotation 10  Left rotation 8   (Blank rows = not tested)  UPPER EXTREMITY ROM:  Active ROM Right eval Left eval  Shoulder flexion 83 708  Shoulder extension [facility] WFL  Shoulder abduction [facility]  Shoulder adduction     (Blank rows = not tested)  UPPER EXTREMITY MMT:  *deferred due to precautions   CERVICAL SPECIAL TESTS:  *deferred due to precaution    TODAY'S TREATMENT:                                                                                                                              DATE: 12/04/22  Manual:  Scar tissue massage supine x4 minutes Upper trap massage x 64mnutes  Median nerve glide 10x   TherEx: Supine: Cervical rotation L and R 10x 3 second holds within pain free range 10x; second trial hold 5 seconds Cervical flexion 10x with focus on control and slow activation second trial hold 5 seconds Cervical  extension  10x ; second trial hold 5 seconds Scapular retraction into plinth 15x AAROM overhead flexion 15x with cane    Seated scapular retraction 10x each LE  Seated RTB row 15x   PATIENT EDUCATION:  Education details: goals, POC, HEP  Person educated: Patient Education method: Explanation, Demonstration, Tactile cues, Verbal cues, and Handouts Education comprehension: verbalized understanding,  returned demonstration, verbal cues required, and tactile cues required  HOME EXERCISE PROGRAM: Access Code: [email or web address] URL: [email or web address] Date: 12/02/2022 Prepared by: Janna Arch  Exercises - Seated Scapular Retraction  - 1 x daily - 7 x weekly - 2 sets - 10 reps - 5 hold  ASSESSMENT:  CLINICAL IMPRESSION: Patient tolerates introduction to safe mobility of spinal column. Still awaiting protocol from surgeon at this time. Postural correction performed as well as STM to bilateral upper traps. Nerve glides introduced for reduction of radiating symptoms.   Patient will benefit from skilled physical therapy to reduce pain, improve ROM, and return to PLOF.   OBJECTIVE IMPAIRMENTS: decreased activity tolerance, decreased mobility, decreased ROM, decreased strength, hypomobility, increased fascial restrictions, impaired flexibility, impaired sensation, impaired UE functional use, improper body mechanics, postural dysfunction, and pain.   ACTIVITY LIMITATIONS: carrying, lifting, bending, standing, squatting, sleeping, transfers, bed mobility, dressing, reach over head, hygiene/grooming, and caring for others  PARTICIPATION LIMITATIONS: meal prep, cleaning, laundry, driving, shopping, community activity, yard work, school, and church  PERSONAL FACTORS: Age, Time since onset of injury/illness/exacerbation, and 3+ comorbidities: arthritis, asthma, collagen vascular disease, diabetes, slipped disc L3-4, HLD, HTN, neuropathy  are also affecting patient's functional outcome.   REHAB POTENTIAL: Good  CLINICAL DECISION MAKING: Stable/uncomplicated  EVALUATION COMPLEXITY: Low   GOALS: Goals reviewed with patient? Yes  SHORT TERM GOALS: Target date: 12/16/2022    Patient will be independent in home exercise program to improve strength/mobility for better functional independence with ADLs. Baseline: 3/11: HEP given  Goal status: INITIAL    LONG TERM GOALS: Target date:  01/27/2023    Patient will increase FOTO score to equal to or greater than  56   to demonstrate statistically significant improvement in mobility and quality of life Baseline: 47% Goal status: INITIAL  2.  Patient will reduce Neck Disability Index score to <10% to demonstrate minimal disability with ADL's including improved sleeping tolerance, sitting tolerance, etc for better mobility at home and work. Baseline: 3/11: 40% Goal status: INITIAL  3.  Patient will increase cervical ROM within 10 degrees of standard range to return to driving, functional movement.  Baseline: 3/11: see above Goal status: INITIAL  4.  Patient will return to full gym program without restrictions.  Baseline: 3/11: limited by restrictions Goal status: INITIAL   PLAN:  PT FREQUENCY: 2x/week  PT DURATION: 8 weeks  PLANNED INTERVENTIONS: Therapeutic exercises, Therapeutic activity, Neuromuscular re-education, Balance training, Gait training, Patient/Family education, Self Care, Joint mobilization, Stair training, Vestibular training, Canalith repositioning, DME instructions, Dry Needling, Electrical stimulation, Spinal mobilization, Cryotherapy, Moist heat, Compression bandaging, scar mobilization, Splintting, Taping, Ultrasound, Manual therapy, and Re-evaluation  PLAN FOR NEXT SESSION: cervical AROM in supine position, AAROM UE ROM, scar tissue massage   Janna Arch, PT 12/04/2022, 12:18 PM

## 2022-12-04 ENCOUNTER — Ambulatory Visit: Payer: Medicare Other

## 2022-12-04 ENCOUNTER — Telehealth: Payer: Self-pay | Admitting: Orthopedic Surgery

## 2022-12-04 DIAGNOSIS — R293 Abnormal posture: Secondary | ICD-10-CM

## 2022-12-04 DIAGNOSIS — M542 Cervicalgia: Secondary | ICD-10-CM

## 2022-12-04 NOTE — Telephone Encounter (Signed)
Can do mobilization with PT and he needs to wear collar.   No lifting over 10 pounds.

## 2022-12-04 NOTE — Telephone Encounter (Signed)
-----   Message from Berdine Addison, RN sent at 12/04/2022 11:01 AM EDT ----- Regarding: FW: PT  ----- Message ----- From: Peggyann Shoals Sent: 12/04/2022   8:24 AM EDT To: [facility] Clinical Subject: PT                                             Janna Arch PT with [facility], patient has an appt today. Can you send her over Protocol for ACDF C3-6 and can she take his collar off for ROM lying down? She said you can send her a message through [employer].

## 2022-12-04 NOTE — Telephone Encounter (Signed)
Hello,  Thank you for the response. Are we ok to start gentle AROM supine without the collar in supine position?

## 2022-12-05 NOTE — Telephone Encounter (Signed)
Reviewed with Dr. Izora Ribas. No AROM of cervical spine until he is seen at his 6 week postop visit and has xrays done.

## 2022-12-05 NOTE — Therapy (Signed)
OUTPATIENT PHYSICAL THERAPY CERVICAL TREATMENT   Patient Name: Alejandro Mendoza MRN: [id number] DOB:January 11, 1956, 67 y.o., male Today's Date: 12/10/2022  END OF SESSION:  PT End of Session - 12/10/22 0928     Visit Number 3    Number of Visits 16    Date for PT Re-Evaluation 01/27/23    Authorization Type 3/10 eval 12/02/22    PT Start Time 0930    PT Stop Time 1013    PT Time Calculation (min) 43 min    Equipment Utilized During Treatment Cervical collar    Activity Tolerance Patient tolerated treatment well    Behavior During Therapy WFL for tasks assessed/performed              Past Medical History:  Diagnosis Date   Arthritis    Asthma    ENVIRONMENTAL IN 2000-ALBUTEROL PRN   Collagen vascular disease ([place])    Complication of anesthesia    SEVERE HEARTBURN   Diabetes ([place])    Disc displacement, lumbar    SLIPPED DISC L3-4   Hyperlipidemia    Hypertension    Inflammatory arthritis    Neuropathy    Slipped disc in neck    Past Surgical History:  Procedure Laterality Date   ANTERIOR CERVICAL DECOMP/DISCECTOMY FUSION N/A 11/04/2022   Procedure: C3-6 INSTRUMENTATION, C4 CORPECTOMY, C5-6 ANTERIOR CERVICAL DISCECTOMY AND FUSION;  Surgeon: Meade Maw, MD;  Location: [facility];  Service: Neurosurgery;  Laterality: N/A;   COLONOSCOPY  2019   CONTINUOUS NERVE MONITORING N/A 06/10/2018   Procedure: FACIAL NERVE MONITORING;  Surgeon: Clyde Canterbury, MD;  Location: [facility];  Service: ENT;  Laterality: N/A;   [place] INJECTION     SALIVARY STONE REMOVAL Left 06/10/2018   Procedure: SALIVARY STONE REMOVAL/SIALODUCOPLASTY;  Surgeon: Clyde Canterbury, MD;  Location: [facility];  Service: ENT;  Laterality: Left;   SUBMANDIBULAR GLAND EXCISION Left 06/10/2018   Procedure: EXCISION SUBMANDIBULAR GLAND;  Surgeon: Clyde Canterbury, MD;  Location: [facility];  Service: ENT;  Laterality: Left;   Patient Active Problem List   Diagnosis  Date Noted   Cervical myelopathy ([place]) 11/04/2022   Myelomalacia ([place]) 11/04/2022   Cervical spinal stenosis 11/04/2022   Cervical radiculopathy 11/04/2022    PCP: Baxter Hire MD  REFERRING PROVIDER: Loleta Dicker PA  REFERRING DIAG: s/p ACDF, cervical myelopathy  THERAPY DIAG:  Cervicalgia  Abnormal posture  Rationale for Evaluation and Treatment: Rehabilitation  ONSET DATE: 11/04/22  SUBJECTIVE:  SUBJECTIVE STATEMENT: Patients surgeon replied back to PT on 12/05/22, wants to hold on AROM until 6 weeks check up. Patient reports feeling unsteady today.   PERTINENT HISTORY:  Patient presents to PT for cervical myelopathy s/p ACDF on 11/04/22 for C3-6. Marland Kitchen Patient did have home health PT. Restrictions include not lifting >10 lb until 6 weeks post op; then increase to  25 lb after 12 weeks. Patient has to wear J collar for 6 weeks. Sleeping, eating, and sitting down he can take it off.  Prior to surgery patient underwent physical therapy at [facility]. Patient had numbness in index finger and thumb, then had the whole right arm go numb.  PMH arthritis, asthma, collagen vascular disease, diabetes, slipped disc L3-4, HLD, HTN, neuropathy. Active baseline, goes to the gym, plays bass guitar.     *Per Surgeon on 12/05/22: no more AROM until 6 weeks check up to get X rays.   PAIN:  Are you having pain? Yes: NPRS scale: 1/10 Pain location: neck Pain description: stiffness, aching in the morning Aggravating factors: first waking up Relieving factors: Pain medicine   PRECAUTIONS: Cervical  WEIGHT BEARING RESTRICTIONS: Yes Restrictions include not lifting >10 lb until 6 weeks post op; then increase to  25 lb after 12 weeks.  FALLS:  Has patient fallen in last 6 months?  No  LIVING ENVIRONMENT: Lives with: lives with their spouse Lives in: House/apartment Stairs: No Has following equipment at home: Single point cane and Environmental consultant - 2 wheeled  OCCUPATION: retired  PLOF: Independent  PATIENT GOALS: to get back to normal   NEXT MD VISIT: 12/17/22  OBJECTIVE:   DIAGNOSTIC FINDINGS:  IMPRESSION: 1. C4 corpectomy and C3-C6 anterior cervical fusion.  PATIENT SURVEYS:  NDI 40% FOTO 47%  COGNITION: Overall cognitive status: Within functional limits for tasks assessed  SENSATION: Light touch: Impaired ; slight difference on the R  POSTURE: rounded shoulders and forward head  PALPATION: Surgical scar anterior neck    CERVICAL ROM:   Active ROM   A/PROM (deg) Eval  12/02/22  Flexion 10  Extension 11  Right lateral flexion 5  Left lateral flexion 10  Right rotation 10  Left rotation 8   (Blank rows = not tested)  UPPER EXTREMITY ROM:  Active ROM Right eval Left eval  Shoulder flexion 83 708  Shoulder extension [facility] WFL  Shoulder abduction [facility]  Shoulder adduction     (Blank rows = not tested)  UPPER EXTREMITY MMT:  *deferred due to precautions   CERVICAL SPECIAL TESTS:  *deferred due to precaution    TODAY'S TREATMENT:                                                                                                                              DATE: 12/10/22  Manual:  Scar tissue massage supine x4 minutes Upper trap massage x 10 minutes    TherEx:  scifit 3 minutes forward, 3 minutes backwards;  seat position 8; lvl 4 ; cue for posture awareness and sequecing.   Seated scapular retraction 10x each LE  Seated RTB row 15x; x 2 sets superset with median nerve glide 10x each UE x2 sets UE ranger: -clockwise circle 10x each direction -alphabet  Sit to stand 5x from plinth table.      PATIENT EDUCATION:  Education details: goals, POC, HEP  Person educated: Patient Education method: Explanation, Demonstration,  Tactile cues, Verbal cues, and Handouts Education comprehension: verbalized understanding, returned demonstration, verbal cues required, and tactile cues required  HOME EXERCISE PROGRAM: Access Code: [email or web address] URL: [email or web address] Date: 12/02/2022 Prepared by: Janna Arch  Exercises - Seated Scapular Retraction  - 1 x daily - 7 x weekly - 2 sets - 10 reps - 5 hold  ASSESSMENT:  CLINICAL IMPRESSION: Patient's surgeon replied back to PT about restrictions after last visit, will hold on AROM until 6 weeks s/p X ray. Patient tolerates postural strengthening and UE ROM interventions well with minimal pain increase. Initially presents with pain that is reduced by end of session.   Patient will benefit from skilled physical therapy to reduce pain, improve ROM, and return to PLOF.   OBJECTIVE IMPAIRMENTS: decreased activity tolerance, decreased mobility, decreased ROM, decreased strength, hypomobility, increased fascial restrictions, impaired flexibility, impaired sensation, impaired UE functional use, improper body mechanics, postural dysfunction, and pain.   ACTIVITY LIMITATIONS: carrying, lifting, bending, standing, squatting, sleeping, transfers, bed mobility, dressing, reach over head, hygiene/grooming, and caring for others  PARTICIPATION LIMITATIONS: meal prep, cleaning, laundry, driving, shopping, community activity, yard work, school, and church  PERSONAL FACTORS: Age, Time since onset of injury/illness/exacerbation, and 3+ comorbidities: arthritis, asthma, collagen vascular disease, diabetes, slipped disc L3-4, HLD, HTN, neuropathy  are also affecting patient's functional outcome.   REHAB POTENTIAL: Good  CLINICAL DECISION MAKING: Stable/uncomplicated  EVALUATION COMPLEXITY: Low   GOALS: Goals reviewed with patient? Yes  SHORT TERM GOALS: Target date: 12/16/2022    Patient will be independent in home exercise program to improve strength/mobility for better  functional independence with ADLs. Baseline: 3/11: HEP given  Goal status: INITIAL    LONG TERM GOALS: Target date: 01/27/2023    Patient will increase FOTO score to equal to or greater than  56   to demonstrate statistically significant improvement in mobility and quality of life Baseline: 47% Goal status: INITIAL  2.  Patient will reduce Neck Disability Index score to <10% to demonstrate minimal disability with ADL's including improved sleeping tolerance, sitting tolerance, etc for better mobility at home and work. Baseline: 3/11: 40% Goal status: INITIAL  3.  Patient will increase cervical ROM within 10 degrees of standard range to return to driving, functional movement.  Baseline: 3/11: see above Goal status: INITIAL  4.  Patient will return to full gym program without restrictions.  Baseline: 3/11: limited by restrictions Goal status: INITIAL   PLAN:  PT FREQUENCY: 2x/week  PT DURATION: 8 weeks  PLANNED INTERVENTIONS: Therapeutic exercises, Therapeutic activity, Neuromuscular re-education, Balance training, Gait training, Patient/Family education, Self Care, Joint mobilization, Stair training, Vestibular training, Canalith repositioning, DME instructions, Dry Needling, Electrical stimulation, Spinal mobilization, Cryotherapy, Moist heat, Compression bandaging, scar mobilization, Splintting, Taping, Ultrasound, Manual therapy, and Re-evaluation  PLAN FOR NEXT SESSION: AAROM UE ROM, scar tissue massage, posture strengthening, stabilization    Janna Arch, PT 12/10/2022, 11:13 AM

## 2022-12-10 ENCOUNTER — Ambulatory Visit: Payer: Medicare Other

## 2022-12-10 DIAGNOSIS — R293 Abnormal posture: Secondary | ICD-10-CM

## 2022-12-10 DIAGNOSIS — M542 Cervicalgia: Secondary | ICD-10-CM

## 2022-12-10 NOTE — Telephone Encounter (Signed)
Thank you. Will implement starting today. Still have not received protocol. Please fax to [phone]. I appreciate the assistance!

## 2022-12-10 NOTE — Telephone Encounter (Signed)
We do not have a specific protocol. These are our postop restrictions.   No AROM of neck until cleared at his 6 week visit.  No lifting over 10 pounds until his 6 week follow up.  Wear collar when out of bed.   Let me know if you need anything else.

## 2022-12-11 NOTE — Therapy (Signed)
OUTPATIENT PHYSICAL THERAPY CERVICAL TREATMENT   Patient Name: Alejandro Mendoza MRN: [id number] DOB:May 12, 1956, 67 y.o., male Today's Date: 12/12/2022  END OF SESSION:  PT End of Session - 12/12/22 1104     Visit Number 4    Number of Visits 16    Date for PT Re-Evaluation 01/27/23    Authorization Type 3/10 eval 12/02/22    PT Start Time 1103    PT Stop Time 1144    PT Time Calculation (min) 41 min    Equipment Utilized During Treatment Cervical collar    Activity Tolerance Patient tolerated treatment well    Behavior During Therapy WFL for tasks assessed/performed               Past Medical History:  Diagnosis Date   Arthritis    Asthma    ENVIRONMENTAL IN 2000-ALBUTEROL PRN   Collagen vascular disease ([place])    Complication of anesthesia    SEVERE HEARTBURN   Diabetes ([place])    Disc displacement, lumbar    SLIPPED DISC L3-4   Hyperlipidemia    Hypertension    Inflammatory arthritis    Neuropathy    Slipped disc in neck    Past Surgical History:  Procedure Laterality Date   ANTERIOR CERVICAL DECOMP/DISCECTOMY FUSION N/A 11/04/2022   Procedure: C3-6 INSTRUMENTATION, C4 CORPECTOMY, C5-6 ANTERIOR CERVICAL DISCECTOMY AND FUSION;  Surgeon: Meade Maw, MD;  Location: [facility];  Service: Neurosurgery;  Laterality: N/A;   COLONOSCOPY  2019   CONTINUOUS NERVE MONITORING N/A 06/10/2018   Procedure: FACIAL NERVE MONITORING;  Surgeon: Clyde Canterbury, MD;  Location: [facility];  Service: ENT;  Laterality: N/A;   [place] INJECTION     SALIVARY STONE REMOVAL Left 06/10/2018   Procedure: SALIVARY STONE REMOVAL/SIALODUCOPLASTY;  Surgeon: Clyde Canterbury, MD;  Location: [facility];  Service: ENT;  Laterality: Left;   SUBMANDIBULAR GLAND EXCISION Left 06/10/2018   Procedure: EXCISION SUBMANDIBULAR GLAND;  Surgeon: Clyde Canterbury, MD;  Location: [facility];  Service: ENT;  Laterality: Left;   Patient Active Problem List    Diagnosis Date Noted   Cervical myelopathy ([place]) 11/04/2022   Myelomalacia ([place]) 11/04/2022   Cervical spinal stenosis 11/04/2022   Cervical radiculopathy 11/04/2022    PCP: Baxter Hire MD  REFERRING PROVIDER: Loleta Dicker PA  REFERRING DIAG: s/p ACDF, cervical myelopathy  THERAPY DIAG:  Cervicalgia  Abnormal posture  Rationale for Evaluation and Treatment: Rehabilitation  ONSET DATE: 11/04/22  SUBJECTIVE:  SUBJECTIVE STATEMENT: Patient reports compliance with HEP.  Went to grocery store yesterday.   PERTINENT HISTORY:  Patient presents to PT for cervical myelopathy s/p ACDF on 11/04/22 for C3-6. Marland Kitchen Patient did have home health PT. Restrictions include not lifting >10 lb until 6 weeks post op; then increase to  25 lb after 12 weeks. Patient has to wear J collar for 6 weeks. Sleeping, eating, and sitting down he can take it off.  Prior to surgery patient underwent physical therapy at [facility]. Patient had numbness in index finger and thumb, then had the whole right arm go numb.  PMH arthritis, asthma, collagen vascular disease, diabetes, slipped disc L3-4, HLD, HTN, neuropathy. Active baseline, goes to the gym, plays bass guitar.     *Per Surgeon on 12/05/22: no more AROM until 6 weeks check up to get X rays.   PAIN:  Are you having pain? Yes: NPRS scale: 1/10 Pain location: neck Pain description: stiffness, aching in the morning Aggravating factors: first waking up Relieving factors: Pain medicine   PRECAUTIONS: Cervical  WEIGHT BEARING RESTRICTIONS: Yes Restrictions include not lifting >10 lb until 6 weeks post op; then increase to  25 lb after 12 weeks.  FALLS:  Has patient fallen in last 6 months? No  LIVING ENVIRONMENT: Lives with: lives with their  spouse Lives in: House/apartment Stairs: No Has following equipment at home: Single point cane and Environmental consultant - 2 wheeled  OCCUPATION: retired  PLOF: Independent  PATIENT GOALS: to get back to normal   NEXT MD VISIT: 12/17/22  OBJECTIVE:   DIAGNOSTIC FINDINGS:  IMPRESSION: 1. C4 corpectomy and C3-C6 anterior cervical fusion.  PATIENT SURVEYS:  NDI 40% FOTO 47%  COGNITION: Overall cognitive status: Within functional limits for tasks assessed  SENSATION: Light touch: Impaired ; slight difference on the R  POSTURE: rounded shoulders and forward head  PALPATION: Surgical scar anterior neck    CERVICAL ROM:   Active ROM   A/PROM (deg) Eval  12/02/22  Flexion 10  Extension 11  Right lateral flexion 5  Left lateral flexion 10  Right rotation 10  Left rotation 8   (Blank rows = not tested)  UPPER EXTREMITY ROM:  Active ROM Right eval Left eval  Shoulder flexion 83 708  Shoulder extension [facility] WFL  Shoulder abduction [facility]  Shoulder adduction     (Blank rows = not tested)  UPPER EXTREMITY MMT:  *deferred due to precautions   CERVICAL SPECIAL TESTS:  *deferred due to precaution    TODAY'S TREATMENT:                                                                                                                              DATE: 12/12/22  Manual:  Scar tissue massage supine x4 minutes Upper trap massage x 8 minutes    TherEx:  scifit 3 minutes forward, 3 minutes backwards; seat position 8; lvl 4 ; cue for posture awareness and sequecing.  Seated  scapular retraction 10x each LE  Overhead raise with cane 10x  Seated LAQ 10x 5 second holds each LE RTB alternating ER/IR 10x each side RTB paloff press 10x each side   Standing  Standing march 10x each LE; focus on being on single limb; x 2 sets  Standing row 10x RTB 2 sets Standing straight arm extension RTB 2 sets    PATIENT EDUCATION:  Education details: goals, POC, HEP  Person educated:  Patient Education method: Explanation, Demonstration, Tactile cues, Verbal cues, and Handouts Education comprehension: verbalized understanding, returned demonstration, verbal cues required, and tactile cues required  HOME EXERCISE PROGRAM: Access Code: [email or web address] URL: [email or web address] Date: 12/02/2022 Prepared by: Janna Arch  Exercises - Seated Scapular Retraction  - 1 x daily - 7 x weekly - 2 sets - 10 reps - 5 hold  ASSESSMENT:  CLINICAL IMPRESSION: Patient still under restrictions for AROM until he sees surgeon on 12/17/22.  Patient introduced to strengthening interventions due to fatigue s/p surgery. LE weakness is noted with need for rest breaks between sets. Patient is highly motivated for progression of care.  Patient will benefit from skilled physical therapy to reduce pain, improve ROM, and return to PLOF.   OBJECTIVE IMPAIRMENTS: decreased activity tolerance, decreased mobility, decreased ROM, decreased strength, hypomobility, increased fascial restrictions, impaired flexibility, impaired sensation, impaired UE functional use, improper body mechanics, postural dysfunction, and pain.   ACTIVITY LIMITATIONS: carrying, lifting, bending, standing, squatting, sleeping, transfers, bed mobility, dressing, reach over head, hygiene/grooming, and caring for others  PARTICIPATION LIMITATIONS: meal prep, cleaning, laundry, driving, shopping, community activity, yard work, school, and church  PERSONAL FACTORS: Age, Time since onset of injury/illness/exacerbation, and 3+ comorbidities: arthritis, asthma, collagen vascular disease, diabetes, slipped disc L3-4, HLD, HTN, neuropathy  are also affecting patient's functional outcome.   REHAB POTENTIAL: Good  CLINICAL DECISION MAKING: Stable/uncomplicated  EVALUATION COMPLEXITY: Low   GOALS: Goals reviewed with patient? Yes  SHORT TERM GOALS: Target date: 12/16/2022    Patient will be independent in home exercise  program to improve strength/mobility for better functional independence with ADLs. Baseline: 3/11: HEP given  Goal status: INITIAL    LONG TERM GOALS: Target date: 01/27/2023    Patient will increase FOTO score to equal to or greater than  56   to demonstrate statistically significant improvement in mobility and quality of life Baseline: 47% Goal status: INITIAL  2.  Patient will reduce Neck Disability Index score to <10% to demonstrate minimal disability with ADL's including improved sleeping tolerance, sitting tolerance, etc for better mobility at home and work. Baseline: 3/11: 40% Goal status: INITIAL  3.  Patient will increase cervical ROM within 10 degrees of standard range to return to driving, functional movement.  Baseline: 3/11: see above Goal status: INITIAL  4.  Patient will return to full gym program without restrictions.  Baseline: 3/11: limited by restrictions Goal status: INITIAL   PLAN:  PT FREQUENCY: 2x/week  PT DURATION: 8 weeks  PLANNED INTERVENTIONS: Therapeutic exercises, Therapeutic activity, Neuromuscular re-education, Balance training, Gait training, Patient/Family education, Self Care, Joint mobilization, Stair training, Vestibular training, Canalith repositioning, DME instructions, Dry Needling, Electrical stimulation, Spinal mobilization, Cryotherapy, Moist heat, Compression bandaging, scar mobilization, Splintting, Taping, Ultrasound, Manual therapy, and Re-evaluation  PLAN FOR NEXT SESSION: AAROM UE ROM, scar tissue massage, posture strengthening, stabilization    Janna Arch, PT 12/12/2022, 12:34 PM

## 2022-12-12 ENCOUNTER — Ambulatory Visit: Payer: Medicare Other

## 2022-12-12 DIAGNOSIS — M542 Cervicalgia: Secondary | ICD-10-CM

## 2022-12-12 DIAGNOSIS — R293 Abnormal posture: Secondary | ICD-10-CM

## 2022-12-13 ENCOUNTER — Other Ambulatory Visit: Payer: Self-pay

## 2022-12-13 DIAGNOSIS — Z981 Arthrodesis status: Secondary | ICD-10-CM

## 2022-12-16 ENCOUNTER — Ambulatory Visit: Payer: Medicare Other | Admitting: Physical Therapy

## 2022-12-17 ENCOUNTER — Ambulatory Visit
Admission: RE | Admit: 2022-12-17 | Discharge: 2022-12-17 | Disposition: A | Discharge status: Home / Self Care | Payer: Medicare Other | Attending: Neurosurgery | Admitting: Neurosurgery

## 2022-12-17 ENCOUNTER — Ambulatory Visit (INDEPENDENT_AMBULATORY_CARE_PROVIDER_SITE_OTHER): Payer: Medicare Other | Admitting: Neurosurgery

## 2022-12-17 ENCOUNTER — Ambulatory Visit
Admission: RE | Admit: 2022-12-17 | Discharge: 2022-12-17 | Disposition: A | Discharge status: Home / Self Care | Payer: Medicare Other | Source: Ambulatory Visit | Attending: Neurosurgery | Admitting: Neurosurgery

## 2022-12-17 ENCOUNTER — Encounter: Payer: Self-pay | Admitting: Neurosurgery

## 2022-12-17 VITALS — BP 140/80 | HR 69 | Temp 98.3°F | Ht 75.0 in | Wt 250.0 lb

## 2022-12-17 DIAGNOSIS — G959 Disease of spinal cord, unspecified: Secondary | ICD-10-CM

## 2022-12-17 DIAGNOSIS — Z981 Arthrodesis status: Secondary | ICD-10-CM | POA: Insufficient documentation

## 2022-12-17 DIAGNOSIS — Z09 Encounter for follow-up examination after completed treatment for conditions other than malignant neoplasm: Secondary | ICD-10-CM

## 2022-12-17 NOTE — Progress Notes (Unsigned)
   REFERRING PHYSICIAN:  Baxter Hire, [address] [place],  [place] [id number]  DOS: 11/04/22 C4 corpectomy, C5-6 ACDF, C3-6 instrumentation  HISTORY OF PRESENT ILLNESS: Alejandro Mendoza is status post cervical fusion.   Overall, he is doing well postoperatively.  His swallowing has gotten better.  His balance is much better than it was prior to surgery.  He is very happy with that.     PHYSICAL EXAMINATION:  NEUROLOGICAL:  General: In no acute distress.   Awake, alert, oriented to person, place, and time.  Pupils equal round and reactive to light.  Facial tone is symmetric.  Tongue protrusion is midline.  There is no pronator drift.  Strength: Side Biceps Triceps Deltoid Interossei Grip Wrist Ext. Wrist Flex.  R 5 5 5 5 5 5 5   L 5 5 5 5 5 5 5    Incision c/d/I and healing well  Imaging:  No complications noted  Assessment / Plan: Alejandro Mendoza is doing well after anterior cervical decompression and fusion.  I am very pleased with his improvements in symptoms.  He will start physical therapy tomorrow.  I have given him some exercises for his neck.  He is on a 25 pound lifting limit.  He can slowly increase activity.  Will see him back in 6 weeks with x-rays.   Advised to contact the office if any questions or concerns arise.   Meade Maw MD Dept of Neurosurgery

## 2022-12-18 ENCOUNTER — Ambulatory Visit: Payer: Medicare Other | Admitting: Physical Therapy

## 2022-12-18 DIAGNOSIS — M542 Cervicalgia: Secondary | ICD-10-CM

## 2022-12-18 DIAGNOSIS — R293 Abnormal posture: Secondary | ICD-10-CM

## 2022-12-18 NOTE — Therapy (Signed)
OUTPATIENT PHYSICAL THERAPY CERVICAL TREATMENT   Patient Name: Alejandro Mendoza MRN: [id number] DOB:May 14, 1956, 67 y.o., male Today's Date: 12/18/2022  END OF SESSION:  PT End of Session - 12/18/22 1110     Visit Number 5    Number of Visits 16    Date for PT Re-Evaluation 01/27/23    Authorization Type 3/10 eval 12/02/22    PT Start Time 1105    PT Stop Time 1146    PT Time Calculation (min) 41 min    Equipment Utilized During Treatment Cervical collar    Activity Tolerance Patient tolerated treatment well    Behavior During Therapy WFL for tasks assessed/performed               Past Medical History:  Diagnosis Date   Arthritis    Asthma    ENVIRONMENTAL IN 2000-ALBUTEROL PRN   Collagen vascular disease ([place])    Complication of anesthesia    SEVERE HEARTBURN   Diabetes ([place])    Disc displacement, lumbar    SLIPPED DISC L3-4   Hyperlipidemia    Hypertension    Inflammatory arthritis    Neuropathy    Slipped disc in neck    Past Surgical History:  Procedure Laterality Date   ANTERIOR CERVICAL DECOMP/DISCECTOMY FUSION N/A 11/04/2022   Procedure: C3-6 INSTRUMENTATION, C4 CORPECTOMY, C5-6 ANTERIOR CERVICAL DISCECTOMY AND FUSION;  Surgeon: Meade Maw, MD;  Location: [facility];  Service: Neurosurgery;  Laterality: N/A;   COLONOSCOPY  2019   CONTINUOUS NERVE MONITORING N/A 06/10/2018   Procedure: FACIAL NERVE MONITORING;  Surgeon: Clyde Canterbury, MD;  Location: [facility];  Service: ENT;  Laterality: N/A;   [place] INJECTION     SALIVARY STONE REMOVAL Left 06/10/2018   Procedure: SALIVARY STONE REMOVAL/SIALODUCOPLASTY;  Surgeon: Clyde Canterbury, MD;  Location: [facility];  Service: ENT;  Laterality: Left;   SUBMANDIBULAR GLAND EXCISION Left 06/10/2018   Procedure: EXCISION SUBMANDIBULAR GLAND;  Surgeon: Clyde Canterbury, MD;  Location: [facility];  Service: ENT;  Laterality: Left;   Patient Active Problem List    Diagnosis Date Noted   Cervical myelopathy ([place]) 11/04/2022   Myelomalacia ([place]) 11/04/2022   Cervical spinal stenosis 11/04/2022   Cervical radiculopathy 11/04/2022    PCP: Baxter Hire MD  REFERRING PROVIDER: Loleta Dicker PA  REFERRING DIAG: s/p ACDF, cervical myelopathy  THERAPY DIAG:  Cervicalgia  Abnormal posture  Rationale for Evaluation and Treatment: Rehabilitation  ONSET DATE: 11/04/22  SUBJECTIVE:  SUBJECTIVE STATEMENT: Pt reports no pain this morning, some aches and pains. Pt reports Postop x-ray and MD for 6week followup for ACDF surgery.   PERTINENT HISTORY:  Patient presents to PT for cervical myelopathy s/p ACDF on 11/04/22 for C3-6. Marland Kitchen Patient did have home health PT. Restrictions include not lifting >10 lb until 6 weeks post op; then increase to  25 lb after 12 weeks. Patient has to wear J collar for 6 weeks. Sleeping, eating, and sitting down he can take it off.  Prior to surgery patient underwent physical therapy at [facility]. Patient had numbness in index finger and thumb, then had the whole right arm go numb.  PMH arthritis, asthma, collagen vascular disease, diabetes, slipped disc L3-4, HLD, HTN, neuropathy. Active baseline, goes to the gym, plays bass guitar.     Pt has been cleared for AROM with exercises provided by MD at 6 week follow-up appointment. Pt reports that MD still wants pt to remain in C-collar for commnuity mobility until 12 week follow up.    PAIN:  Are you having pain? Yes: NPRS scale: 1/10 Pain location: neck Pain description: stiffness, aching in the morning Aggravating factors: first waking up Relieving factors: Pain medicine   PRECAUTIONS: Cervical  WEIGHT BEARING RESTRICTIONS: Yes Restrictions include not lifting >10 lb  until 6 weeks post op; then increase to  25 lb after 12 weeks.  FALLS:  Has patient fallen in last 6 months? No  LIVING ENVIRONMENT: Lives with: lives with their spouse Lives in: House/apartment Stairs: No Has following equipment at home: Single point cane and Environmental consultant - 2 wheeled  OCCUPATION: retired  PLOF: Independent  PATIENT GOALS: to get back to normal   NEXT MD VISIT: 12/17/22  OBJECTIVE:   DIAGNOSTIC FINDINGS:  IMPRESSION: 1. C4 corpectomy and C3-C6 anterior cervical fusion.  PATIENT SURVEYS:  NDI 40% FOTO 47%  COGNITION: Overall cognitive status: Within functional limits for tasks assessed  SENSATION: Light touch: Impaired ; slight difference on the R  POSTURE: rounded shoulders and forward head  PALPATION: Surgical scar anterior neck    CERVICAL ROM:   Active ROM   A/PROM (deg) Eval  12/02/22  Flexion 10  Extension 11  Right lateral flexion 5  Left lateral flexion 10  Right rotation 10  Left rotation 8   (Blank rows = not tested)  UPPER EXTREMITY ROM:  Active ROM Right eval Left eval  Shoulder flexion 83 708  Shoulder extension [facility] WFL  Shoulder abduction [facility]  Shoulder adduction     (Blank rows = not tested)  UPPER EXTREMITY MMT:  *deferred due to precautions   CERVICAL SPECIAL TESTS:  *deferred due to precaution    TODAY'S TREATMENT:                                                                                                                              DATE: 12/18/22  Manual:  Scar tissue massage supine x4  minutes Upper trap massage x 8 minutes    TherEx:  Performed HEP provided by MD: Supine cervical rotation x 12 with 10 sec hold bil Supine "Clock face" cervical circles x 15 CW and x15 CCW  Supine Head lift x 12 Lateral flexion in sitting x 10 bil with 10 sec hold.  Scapular retraction AROM with hands in "W" x 15 Reciprocal Shoulder flexion/extension x 12 with 2 sec hold Qped shoulder flexion/extension x 5  bil. Education for modified positioning on counter or back of chair if pressure on knees limits tolerance to qped.   Cues to remain in pain free range throughout movement. Mild "tension" noted with initial lateral rotation L and cervical flexion, but decreased with repetitions for each movement.    PATIENT EDUCATION:  Education details: Pt educated throughout session about proper posture and technique with exercises. Improved exercise technique, movement at target joints, use of target muscles after min to mod verbal, visual, tactile cues. goals, POC, HEP  Person educated: Patient Education method: Explanation, Demonstration, Tactile cues, Verbal cues, and Handouts Education comprehension: verbalized understanding, returned demonstration, verbal cues required, and tactile cues required  HOME EXERCISE PROGRAM: Access Code: [email or web address] URL: [email or web address] Date: 12/02/2022 Prepared by: Janna Arch  Exercises - Seated Scapular Retraction  - 1 x daily - 7 x weekly - 2 sets - 10 reps - 5 hold  ASSESSMENT:  CLINICAL IMPRESSION: Pt has been  cleared for AROM with HEP provided by surgeon. PT educated pt in proper completion HEP exercises with modifications for qped exercises if needed.  Pt able to complete most exercises on this day with only mild discomfort, but no pain reported. Will benefit from continued progressive AROM, postural strengthening, and BLE strengthening to improve safety with high level mobility. Patient will benefit from skilled physical therapy to reduce pain, improve ROM, and return to PLOF.   OBJECTIVE IMPAIRMENTS: decreased activity tolerance, decreased mobility, decreased ROM, decreased strength, hypomobility, increased fascial restrictions, impaired flexibility, impaired sensation, impaired UE functional use, improper body mechanics, postural dysfunction, and pain.   ACTIVITY LIMITATIONS: carrying, lifting, bending, standing, squatting, sleeping,  transfers, bed mobility, dressing, reach over head, hygiene/grooming, and caring for others  PARTICIPATION LIMITATIONS: meal prep, cleaning, laundry, driving, shopping, community activity, yard work, school, and church  PERSONAL FACTORS: Age, Time since onset of injury/illness/exacerbation, and 3+ comorbidities: arthritis, asthma, collagen vascular disease, diabetes, slipped disc L3-4, HLD, HTN, neuropathy  are also affecting patient's functional outcome.   REHAB POTENTIAL: Good  CLINICAL DECISION MAKING: Stable/uncomplicated  EVALUATION COMPLEXITY: Low   GOALS: Goals reviewed with patient? Yes  SHORT TERM GOALS: Target date: 12/16/2022    Patient will be independent in home exercise program to improve strength/mobility for better functional independence with ADLs. Baseline: 3/11: HEP given  Goal status: INITIAL    LONG TERM GOALS: Target date: 01/27/2023    Patient will increase FOTO score to equal to or greater than  56   to demonstrate statistically significant improvement in mobility and quality of life Baseline: 47% Goal status: INITIAL  2.  Patient will reduce Neck Disability Index score to <10% to demonstrate minimal disability with ADL's including improved sleeping tolerance, sitting tolerance, etc for better mobility at home and work. Baseline: 3/11: 40% Goal status: INITIAL  3.  Patient will increase cervical ROM within 10 degrees of standard range to return to driving, functional movement.  Baseline: 3/11: see above Goal status: INITIAL  4.  Patient will return to full gym program without restrictions.  Baseline: 3/11: limited by restrictions Goal status: INITIAL   PLAN:  PT FREQUENCY: 2x/week  PT DURATION: 8 weeks  PLANNED INTERVENTIONS: Therapeutic exercises, Therapeutic activity, Neuromuscular re-education, Balance training, Gait training, Patient/Family education, Self Care, Joint mobilization, Stair training, Vestibular training, Canalith  repositioning, DME instructions, Dry Needling, Electrical stimulation, Spinal mobilization, Cryotherapy, Moist heat, Compression bandaging, scar mobilization, Splintting, Taping, Ultrasound, Manual therapy, and Re-evaluation  PLAN FOR NEXT SESSION: AROM UE ROM, cervical ROM in pain free range, scar tissue massage, posture strengthening, stabilization    Lorie Phenix, PT, DPT 12/18/2022, 12:06 PM

## 2022-12-25 ENCOUNTER — Ambulatory Visit: Payer: Medicare Other | Attending: Neurosurgery

## 2022-12-25 DIAGNOSIS — M542 Cervicalgia: Secondary | ICD-10-CM

## 2022-12-25 DIAGNOSIS — R293 Abnormal posture: Secondary | ICD-10-CM

## 2022-12-25 NOTE — Therapy (Signed)
OUTPATIENT PHYSICAL THERAPY CERVICAL TREATMENT   Patient Name: Alejandro Mendoza MRN: [id number] DOB:October 03, 1955, 67 y.o., male Today's Date: 12/25/2022  END OF SESSION:  PT End of Session - 12/25/22 0850     Visit Number 6    Number of Visits 16    Date for PT Re-Evaluation 01/27/23    Authorization Type 3/10 eval 12/02/22    PT Start Time 0848    PT Stop Time 0928    PT Time Calculation (min) 40 min    Equipment Utilized During Treatment Cervical collar    Activity Tolerance Patient tolerated treatment well    Behavior During Therapy WFL for tasks assessed/performed               Past Medical History:  Diagnosis Date   Arthritis    Asthma    ENVIRONMENTAL IN 2000-ALBUTEROL PRN   Collagen vascular disease    Complication of anesthesia    SEVERE HEARTBURN   Diabetes    Disc displacement, lumbar    SLIPPED DISC L3-4   Hyperlipidemia    Hypertension    Inflammatory arthritis    Neuropathy    Slipped disc in neck    Past Surgical History:  Procedure Laterality Date   ANTERIOR CERVICAL DECOMP/DISCECTOMY FUSION N/A 11/04/2022   Procedure: C3-6 INSTRUMENTATION, C4 CORPECTOMY, C5-6 ANTERIOR CERVICAL DISCECTOMY AND FUSION;  Surgeon: Meade Maw, MD;  Location: [facility];  Service: Neurosurgery;  Laterality: N/A;   COLONOSCOPY  2019   CONTINUOUS NERVE MONITORING N/A 06/10/2018   Procedure: FACIAL NERVE MONITORING;  Surgeon: Clyde Canterbury, MD;  Location: [facility];  Service: ENT;  Laterality: N/A;   [place] INJECTION     SALIVARY STONE REMOVAL Left 06/10/2018   Procedure: SALIVARY STONE REMOVAL/SIALODUCOPLASTY;  Surgeon: Clyde Canterbury, MD;  Location: [facility];  Service: ENT;  Laterality: Left;   SUBMANDIBULAR GLAND EXCISION Left 06/10/2018   Procedure: EXCISION SUBMANDIBULAR GLAND;  Surgeon: Clyde Canterbury, MD;  Location: [facility];  Service: ENT;  Laterality: Left;   Patient Active Problem List   Diagnosis Date Noted    Cervical myelopathy 11/04/2022   Myelomalacia 11/04/2022   Cervical spinal stenosis 11/04/2022   Cervical radiculopathy 11/04/2022    PCP: Baxter Hire MD  REFERRING PROVIDER: Loleta Dicker PA  REFERRING DIAG: s/p ACDF, cervical myelopathy  THERAPY DIAG:  Cervicalgia  Abnormal posture  Rationale for Evaluation and Treatment: Rehabilitation  ONSET DATE: 11/04/22  SUBJECTIVE:  SUBJECTIVE STATEMENT: Pt reports no pain this morning, some aches and pains. Pt reports Postop x-ray and MD for 6week followup for ACDF surgery.   PERTINENT HISTORY:  Patient presents to PT for cervical myelopathy s/p ACDF on 11/04/22 for C3-6. Marland Kitchen Patient did have home health PT. Restrictions include not lifting >10 lb until 6 weeks post op; then increase to  25 lb after 12 weeks. Patient has to wear J collar for 6 weeks. Sleeping, eating, and sitting down he can take it off.  Prior to surgery patient underwent physical therapy at [facility]. Patient had numbness in index finger and thumb, then had the whole right arm go numb.  PMH arthritis, asthma, collagen vascular disease, diabetes, slipped disc L3-4, HLD, HTN, neuropathy. Active baseline, goes to the gym, plays bass guitar.     Pt has been cleared for AROM with exercises provided by MD at 6 week follow-up appointment. Pt reports that MD still wants pt to remain in C-collar for commnuity mobility until 12 week follow up.    PAIN:  Are you having pain? Yes: NPRS scale: 1/10 Pain location: neck Pain description: stiffness, aching in the morning Aggravating factors: first waking up Relieving factors: Pain medicine   PRECAUTIONS: Cervical  WEIGHT BEARING RESTRICTIONS: Yes Restrictions include not lifting >10 lb until 6 weeks post op; then  increase to  25 lb after 12 weeks.  FALLS:  Has patient fallen in last 6 months? No  LIVING ENVIRONMENT: Lives with: lives with their spouse Lives in: House/apartment Stairs: No Has following equipment at home: Single point cane and Environmental consultant - 2 wheeled  OCCUPATION: retired  PLOF: Independent  PATIENT GOALS: to get back to normal   NEXT MD VISIT: 12/17/22  OBJECTIVE:   DIAGNOSTIC FINDINGS:  IMPRESSION: 1. C4 corpectomy and C3-C6 anterior cervical fusion.  PATIENT SURVEYS:  NDI 40% FOTO 47%  COGNITION: Overall cognitive status: Within functional limits for tasks assessed  SENSATION: Light touch: Impaired ; slight difference on the R  POSTURE: rounded shoulders and forward head  PALPATION: Surgical scar anterior neck    CERVICAL ROM:   Active ROM   A/PROM (deg) Eval  12/02/22  Flexion 10  Extension 11  Right lateral flexion 5  Left lateral flexion 10  Right rotation 10  Left rotation 8   (Blank rows = not tested)  UPPER EXTREMITY ROM:  Active ROM Right eval Left eval  Shoulder flexion 83 708  Shoulder extension [facility] WFL  Shoulder abduction [facility]  Shoulder adduction     (Blank rows = not tested)  UPPER EXTREMITY MMT:  *deferred due to precautions   CERVICAL SPECIAL TESTS:  *deferred due to precaution    TODAY'S TREATMENT:                                                                                                                              DATE: 12/25/22     TherEx:  Performed HEP  provided by MD from previous visit (Requested patient to bring handout next session to scan into record)   Supine AROM (supine- minimal cervical flex/ext/rotation/SB x approx 20 reps Supine cervical rotation x 12 with 10 sec hold bil Supine "Clock face" cervical circles x 15 CW and x15 CCW  Supine Head lift x 12 Lateral flexion in sitting x 10 bil with 10 sec hold.  Scapular retraction AROM with hands in "W" 2 x 10 reps Reciprocal Shoulder  flexion/extension x 12 with 2 sec hold Qped shoulder flexion/extension x 5 bil. Education for modified positioning on counter or back of chair if pressure on knees limits tolerance to qped.   Cues to remain in pain free range throughout movement. No pain reported with above therex today.  PATIENT EDUCATION:  Education details: Pt educated throughout session about proper posture and technique with exercises. Improved exercise technique, movement at target joints, use of target muscles after min to mod verbal, visual, tactile cues. goals, POC, HEP  Person educated: Patient Education method: Explanation, Demonstration, Tactile cues, Verbal cues, and Handouts Education comprehension: verbalized understanding, returned demonstration, verbal cues required, and tactile cues required   Manual:  Upper trap massage bilateral  x 11 minutes total   HOME EXERCISE PROGRAM: Access Code: [email or web address] URL: [email or web address] Date: 12/02/2022 Prepared by: Janna Arch  Exercises - Seated Scapular Retraction  - 1 x daily - 7 x weekly - 2 sets - 10 reps - 5 hold  ASSESSMENT:  CLINICAL IMPRESSION: Treatment consisted of reviewing new HEP developed by surgeon with VC and min visual demo for correct form or hold time. Frequent reminders to only perform within painfree range. He responded well- able to return demonstration well and no significant report of any pain today.  Patient will benefit from skilled physical therapy to reduce pain, improve ROM, and return to PLOF.   OBJECTIVE IMPAIRMENTS: decreased activity tolerance, decreased mobility, decreased ROM, decreased strength, hypomobility, increased fascial restrictions, impaired flexibility, impaired sensation, impaired UE functional use, improper body mechanics, postural dysfunction, and pain.   ACTIVITY LIMITATIONS: carrying, lifting, bending, standing, squatting, sleeping, transfers, bed mobility, dressing, reach over head,  hygiene/grooming, and caring for others  PARTICIPATION LIMITATIONS: meal prep, cleaning, laundry, driving, shopping, community activity, yard work, school, and church  PERSONAL FACTORS: Age, Time since onset of injury/illness/exacerbation, and 3+ comorbidities: arthritis, asthma, collagen vascular disease, diabetes, slipped disc L3-4, HLD, HTN, neuropathy  are also affecting patient's functional outcome.   REHAB POTENTIAL: Good  CLINICAL DECISION MAKING: Stable/uncomplicated  EVALUATION COMPLEXITY: Low   GOALS: Goals reviewed with patient? Yes  SHORT TERM GOALS: Target date: 12/16/2022    Patient will be independent in home exercise program to improve strength/mobility for better functional independence with ADLs. Baseline: 3/11: HEP given  Goal status: INITIAL    LONG TERM GOALS: Target date: 01/27/2023    Patient will increase FOTO score to equal to or greater than  56   to demonstrate statistically significant improvement in mobility and quality of life Baseline: 47% Goal status: INITIAL  2.  Patient will reduce Neck Disability Index score to <10% to demonstrate minimal disability with ADL's including improved sleeping tolerance, sitting tolerance, etc for better mobility at home and work. Baseline: 3/11: 40% Goal status: INITIAL  3.  Patient will increase cervical ROM within 10 degrees of standard range to return to driving, functional movement.  Baseline: 3/11: see above Goal status: INITIAL  4.  Patient will return to full gym program without restrictions.  Baseline: 3/11:  limited by restrictions Goal status: INITIAL   PLAN:  PT FREQUENCY: 2x/week  PT DURATION: 8 weeks  PLANNED INTERVENTIONS: Therapeutic exercises, Therapeutic activity, Neuromuscular re-education, Balance training, Gait training, Patient/Family education, Self Care, Joint mobilization, Stair training, Vestibular training, Canalith repositioning, DME instructions, Dry Needling, Electrical  stimulation, Spinal mobilization, Cryotherapy, Moist heat, Compression bandaging, scar mobilization, Splintting, Taping, Ultrasound, Manual therapy, and Re-evaluation  PLAN FOR NEXT SESSION: AROM UE ROM, cervical ROM in pain free range, scar tissue massage, posture strengthening, stabilization -Patient to bring in handout from MD to scan into record.   Lewis Moccasin, PT 12/25/2022, 10:07 AM

## 2022-12-27 ENCOUNTER — Ambulatory Visit: Payer: Medicare Other

## 2022-12-27 DIAGNOSIS — R293 Abnormal posture: Secondary | ICD-10-CM

## 2022-12-27 DIAGNOSIS — M542 Cervicalgia: Secondary | ICD-10-CM | POA: Diagnosis not present

## 2022-12-27 NOTE — Therapy (Signed)
OUTPATIENT PHYSICAL THERAPY CERVICAL TREATMENT   Patient Name: Alejandro OlpLarry J Mendoza MRN: [id number] DOB:11-09-1955, 67 y.o., male Today's Date: 12/27/2022  END OF SESSION:  PT End of Session - 12/27/22 0921     Visit Number 7    Number of Visits 16    Date for PT Re-Evaluation 01/27/23    Authorization Type 3/10 eval 12/02/22    Progress Note Due on Visit 10    PT Start Time 0924    PT Stop Time 1005    PT Time Calculation (min) 41 min    Equipment Utilized During Treatment Cervical collar    Activity Tolerance Patient tolerated treatment well    Behavior During Therapy WFL for tasks assessed/performed                Past Medical History:  Diagnosis Date   Arthritis    Asthma    ENVIRONMENTAL IN 2000-ALBUTEROL PRN   Collagen vascular disease    Complication of anesthesia    SEVERE HEARTBURN   Diabetes    Disc displacement, lumbar    SLIPPED DISC L3-4   Hyperlipidemia    Hypertension    Inflammatory arthritis    Neuropathy    Slipped disc in neck    Past Surgical History:  Procedure Laterality Date   ANTERIOR CERVICAL DECOMP/DISCECTOMY FUSION N/A 11/04/2022   Procedure: C3-6 INSTRUMENTATION, C4 CORPECTOMY, C5-6 ANTERIOR CERVICAL DISCECTOMY AND FUSION;  Surgeon: Venetia NightYarbrough, Chester, MD;  Location: [facility];  Service: Neurosurgery;  Laterality: N/A;   COLONOSCOPY  2019   CONTINUOUS NERVE MONITORING N/A 06/10/2018   Procedure: FACIAL NERVE MONITORING;  Surgeon: Geanie LoganBennett, Paul, MD;  Location: [facility];  Service: ENT;  Laterality: N/A;   CYST EXCISION  1969   FROM INSIDE IN JAW   EPIDURAL BLOCK INJECTION     SALIVARY STONE REMOVAL Left 06/10/2018   Procedure: SALIVARY STONE REMOVAL/SIALODUCOPLASTY;  Surgeon: Geanie LoganBennett, Paul, MD;  Location: [facility];  Service: ENT;  Laterality: Left;   SUBMANDIBULAR GLAND EXCISION Left 06/10/2018   Procedure: EXCISION SUBMANDIBULAR GLAND;  Surgeon: Geanie LoganBennett, Paul, MD;  Location: [facility];  Service: ENT;  Laterality: Left;   Patient Active  Problem List   Diagnosis Date Noted   Cervical myelopathy 11/04/2022   Myelomalacia 11/04/2022   Cervical spinal stenosis 11/04/2022   Cervical radiculopathy 11/04/2022    PCP: Gracelyn NurseJohnston, John D MD  REFERRING PROVIDER: Susanne BordersKoch, Danielle Lee PA  REFERRING DIAG: s/p ACDF, cervical myelopathy  THERAPY DIAG:  Cervicalgia  Abnormal posture  Rationale for Evaluation and Treatment: Rehabilitation  ONSET DATE: 11/04/22  SUBJECTIVE:  SUBJECTIVE STATEMENT: I'm doing okay just achy. Heat pad didn't really help at home.    PERTINENT HISTORY:  Patient presents to PT for cervical myelopathy s/p ACDF on 11/04/22 for C3-6. Marland Kitchen. Patient did have home health PT. Restrictions include not lifting >10 lb until 6 weeks post op; then increase to  25 lb after 12 weeks. Patient has to wear J collar for 6 weeks. Sleeping, eating, and sitting down he can take it off.  Prior to surgery [facility] Patient had numbness in index finger and thumb, then had the whole right arm go numb.  PMH arthritis, asthma, collagen vascular disease, diabetes, slipped disc L3-4, HLD, HTN, neuropathy. Active baseline, goes to the gym, plays bass guitar.     Pt has been cleared for AROM with exercises provided by MD at 6 week follow-up appointment. Pt reports that MD still wants pt to remain in C-collar for commnuity mobility until 12 week follow up.    PAIN:  Are you having pain? Yes: NPRS scale: 1/10 Pain location: neck Pain description: stiffness, aching in the morning Aggravating factors: first waking up Relieving factors: Pain medicine   PRECAUTIONS: Cervical  WEIGHT BEARING RESTRICTIONS: Yes Restrictions include not lifting >10 lb until 6 weeks post op; then increase to  25 lb after 12  weeks.  FALLS:  Has patient fallen in last 6 months? No  LIVING ENVIRONMENT: Lives with: lives with their spouse Lives in: House/apartment Stairs: No Has following equipment at home: Single point cane and Environmental consultantWalker - 2 wheeled  OCCUPATION: retired  PLOF: Independent  PATIENT GOALS: to get back to normal   NEXT MD VISIT: 12/17/22  OBJECTIVE:   DIAGNOSTIC FINDINGS:  IMPRESSION: 1. C4 corpectomy and C3-C6 anterior cervical fusion.  PATIENT SURVEYS:  NDI 40% FOTO 47%  COGNITION: Overall cognitive status: Within functional limits for tasks assessed  SENSATION: Light touch: Impaired ; slight difference on the R  POSTURE: rounded shoulders and forward head  PALPATION: Surgical scar anterior neck    CERVICAL ROM:   Active ROM   A/PROM (deg) Eval  12/02/22  Flexion 10  Extension 11  Right lateral flexion 5  Left lateral flexion 10  Right rotation 10  Left rotation 8   (Blank rows = not tested)  UPPER EXTREMITY ROM:  Active ROM Right eval Left eval  Shoulder flexion 83 708  Shoulder extension [facility]  Shoulder abduction [facility]  Shoulder adduction     (Blank rows = not tested)  UPPER EXTREMITY MMT:  *deferred due to precautions   CERVICAL SPECIAL TESTS:  *deferred due to precaution    TODAY'S TREATMENT:                                                                                                                              DATE: 12/27/22   Manual therapy: B UT region STM in sitting (left side more tender but right side  tighter)   TherEx:  Performed HEP provided by MD from previous visit (Requested patient to bring handout next session to scan into record)   Supine AROM (supine- minimal cervical flex/ext/rotation/SB x approx 20 reps Supine cervical rotation x 12 with 10 sec hold bil Supine "Clock face" cervical circles x 15 CW and x15 CCW  Supine Head lift x 12 Supine chin retraction with hold 3 sec x 10  reps Lateral flexion in sitting x  10 bil with 10 sec hold Supine shoulder flex AROM each UE x 15 reps Serratus punches supine each UE x 15 reps .  Scapular retraction AROM with hands in "W" 2 x 10 reps Reciprocal Shoulder flexion/extension x 12 with 2 sec hold Qped shoulder flexion/extension x 5 bil. Education for modified positioning on counter or back of chair if pressure on knees limits tolerance to qped.   Cues to remain in pain free range throughout movement. No pain reported with above therex today.  PATIENT EDUCATION:  Education details: Pt educated throughout session about proper posture and technique with exercises. Improved exercise technique, movement at target joints, use of target muscles after min to mod verbal, visual, tactile cues. goals, POC, HEP  Person educated: Patient Education method: Explanation, Demonstration, Tactile cues, Verbal cues, and Handouts Education comprehension: verbalized understanding, returned demonstration, verbal cues required, and tactile cues required   Manual:  Upper trap massage bilateral  x 11 minutes total   HOME EXERCISE PROGRAM: Access Code: [email or web address] URL: [email or web address] Date: 12/02/2022 Prepared by: Precious Bard  Exercises - Seated Scapular Retraction  - 1 x daily - 7 x weekly - 2 sets - 10 reps - 5 hold  ASSESSMENT:  CLINICAL IMPRESSION: Continued with painfree treatment focusing on active ROM and loosening tension in B UT. He is responding well to all therex- denying any pain during session except from right shoulder tightness with shoulder flexion. He is progressing well and will re-measure next visit. Patient will benefit from skilled physical therapy to reduce pain, improve ROM, and return to PLOF.   OBJECTIVE IMPAIRMENTS: decreased activity tolerance, decreased mobility, decreased ROM, decreased strength, hypomobility, increased fascial restrictions, impaired flexibility, impaired sensation, impaired UE functional use, improper body  mechanics, postural dysfunction, and pain.   ACTIVITY LIMITATIONS: carrying, lifting, bending, standing, squatting, sleeping, transfers, bed mobility, dressing, reach over head, hygiene/grooming, and caring for others  PARTICIPATION LIMITATIONS: meal prep, cleaning, laundry, driving, shopping, community activity, yard work, school, and church  PERSONAL FACTORS: Age, Time since onset of injury/illness/exacerbation, and 3+ comorbidities: arthritis, asthma, collagen vascular disease, diabetes, slipped disc L3-4, HLD, HTN, neuropathy  are also affecting patient's functional outcome.   REHAB POTENTIAL: Good  CLINICAL DECISION MAKING: Stable/uncomplicated  EVALUATION COMPLEXITY: Low   GOALS: Goals reviewed with patient? Yes  SHORT TERM GOALS: Target date: 12/16/2022    Patient will be independent in home exercise program to improve strength/mobility for better functional independence with ADLs. Baseline: 3/11: HEP given  Goal status: INITIAL    LONG TERM GOALS: Target date: 01/27/2023    Patient will increase FOTO score to equal to or greater than  56   to demonstrate statistically significant improvement in mobility and quality of life Baseline: 47% Goal status: INITIAL  2.  Patient will reduce Neck Disability Index score to <10% to demonstrate minimal disability with ADL's including improved sleeping tolerance, sitting tolerance, etc for better mobility at home and work. Baseline: 3/11: 40% Goal status: INITIAL  3.  Patient will increase cervical ROM within 10  degrees of standard range to return to driving, functional movement.  Baseline: 3/11: see above Goal status: INITIAL  4.  Patient will return to full gym program without restrictions.  Baseline: 3/11: limited by restrictions Goal status: INITIAL   PLAN:  PT FREQUENCY: 2x/week  PT DURATION: 8 weeks  PLANNED INTERVENTIONS: Therapeutic exercises, Therapeutic activity, Neuromuscular re-education, Balance training,  Gait training, Patient/Family education, Self Care, Joint mobilization, Stair training, Vestibular training, Canalith repositioning, DME instructions, Dry Needling, Electrical stimulation, Spinal mobilization, Cryotherapy, Moist heat, Compression bandaging, scar mobilization, Splintting, Taping, Ultrasound, Manual therapy, and Re-evaluation  PLAN FOR NEXT SESSION: AROM UE ROM, cervical ROM in pain free range, scar tissue massage, posture strengthening, stabilization -Patient to bring in handout from MD to scan into record.   Lenda Kelp, PT 12/27/2022, 11:24 AM

## 2022-12-31 NOTE — Therapy (Signed)
OUTPATIENT PHYSICAL THERAPY CERVICAL TREATMENT   Patient Name: Tommas OlpLarry J Saraceno MRN: [id number] DOB:11-09-1955, 67 y.o., male Today's Date: 12/27/2022  END OF SESSION:  PT End of Session - 12/27/22 0921     Visit Number 7    Number of Visits 16    Date for PT Re-Evaluation 01/27/23    Authorization Type 3/10 eval 12/02/22    Progress Note Due on Visit 10    PT Start Time 0924    PT Stop Time 1005    PT Time Calculation (min) 41 min    Equipment Utilized During Treatment Cervical collar    Activity Tolerance Patient tolerated treatment well    Behavior During Therapy WFL for tasks assessed/performed                Past Medical History:  Diagnosis Date   Arthritis    Asthma    ENVIRONMENTAL IN 2000-ALBUTEROL PRN   Collagen vascular disease    Complication of anesthesia    SEVERE HEARTBURN   Diabetes    Disc displacement, lumbar    SLIPPED DISC L3-4   Hyperlipidemia    Hypertension    Inflammatory arthritis    Neuropathy    Slipped disc in neck    Past Surgical History:  Procedure Laterality Date   ANTERIOR CERVICAL DECOMP/DISCECTOMY FUSION N/A 11/04/2022   Procedure: C3-6 INSTRUMENTATION, C4 CORPECTOMY, C5-6 ANTERIOR CERVICAL DISCECTOMY AND FUSION;  Surgeon: Venetia NightYarbrough, Chester, MD;  Location: [facility];  Service: Neurosurgery;  Laterality: N/A;   COLONOSCOPY  2019   CONTINUOUS NERVE MONITORING N/A 06/10/2018   Procedure: FACIAL NERVE MONITORING;  Surgeon: Geanie LoganBennett, Paul, MD;  Location: [facility];  Service: ENT;  Laterality: N/A;   CYST EXCISION  1969   FROM INSIDE IN JAW   EPIDURAL BLOCK INJECTION     SALIVARY STONE REMOVAL Left 06/10/2018   Procedure: SALIVARY STONE REMOVAL/SIALODUCOPLASTY;  Surgeon: Geanie LoganBennett, Paul, MD;  Location: [facility];  Service: ENT;  Laterality: Left;   SUBMANDIBULAR GLAND EXCISION Left 06/10/2018   Procedure: EXCISION SUBMANDIBULAR GLAND;  Surgeon: Geanie LoganBennett, Paul, MD;  Location: [facility];  Service: ENT;  Laterality: Left;   Patient Active  Problem List   Diagnosis Date Noted   Cervical myelopathy 11/04/2022   Myelomalacia 11/04/2022   Cervical spinal stenosis 11/04/2022   Cervical radiculopathy 11/04/2022    PCP: Gracelyn NurseJohnston, John D MD  REFERRING PROVIDER: Susanne BordersKoch, Danielle Lee PA  REFERRING DIAG: s/p ACDF, cervical myelopathy  THERAPY DIAG:  Cervicalgia  Abnormal posture  Rationale for Evaluation and Treatment: Rehabilitation  ONSET DATE: 11/04/22  SUBJECTIVE:  SUBJECTIVE STATEMENT: ***I'm doing okay just achy. Heat pad didn't really help at home.    PERTINENT HISTORY:  Patient presents to PT for cervical myelopathy s/p ACDF on 11/04/22 for C3-6. Marland Kitchen. Patient did have home health PT. Restrictions include not lifting >10 lb until 6 weeks post op; then increase to  25 lb after 12 weeks. Patient has to wear J collar for 6 weeks. Sleeping, eating, and sitting down he can take it off.  Prior to surgery [facility] Patient had numbness in index finger and thumb, then had the whole right arm go numb.  PMH arthritis, asthma, collagen vascular disease, diabetes, slipped disc L3-4, HLD, HTN, neuropathy. Active baseline, goes to the gym, plays bass guitar.     Pt has been cleared for AROM with exercises provided by MD at 6 week follow-up appointment. Pt reports that MD still wants pt to remain in C-collar for commnuity mobility until 12 week follow up.    PAIN:  Are you having pain? Yes: NPRS scale: 1/10 Pain location: neck Pain description: stiffness, aching in the morning Aggravating factors: first waking up Relieving factors: Pain medicine   PRECAUTIONS: Cervical  WEIGHT BEARING RESTRICTIONS: Yes Restrictions include not lifting >10 lb until 6 weeks post op; then increase to  25 lb after  12 weeks.  FALLS:  Has patient fallen in last 6 months? No  LIVING ENVIRONMENT: Lives with: lives with their spouse Lives in: House/apartment Stairs: No Has following equipment at home: Single point cane and Environmental consultantWalker - 2 wheeled  OCCUPATION: retired  PLOF: Independent  PATIENT GOALS: to get back to normal   NEXT MD VISIT: 12/17/22  OBJECTIVE:   DIAGNOSTIC FINDINGS:  IMPRESSION: 1. C4 corpectomy and C3-C6 anterior cervical fusion.  PATIENT SURVEYS:  NDI 40% FOTO 47%  COGNITION: Overall cognitive status: Within functional limits for tasks assessed  SENSATION: Light touch: Impaired ; slight difference on the R  POSTURE: rounded shoulders and forward head  PALPATION: Surgical scar anterior neck    CERVICAL ROM:   Active ROM   A/PROM (deg) Eval  12/02/22  Flexion 10  Extension 11  Right lateral flexion 5  Left lateral flexion 10  Right rotation 10  Left rotation 8   (Blank rows = not tested)  UPPER EXTREMITY ROM:  Active ROM Right eval Left eval  Shoulder flexion 83 708  Shoulder extension [facility] W[facility]  Shoulder abduction [facility]  Shoulder adduction     (Blank rows = not tested)  UPPER EXTREMITY MMT:  *deferred due to precautions   CERVICAL SPECIAL TESTS:  *deferred due to precaution    TODAY'S TREATMENT:                                                                                                                              DATE: 12/27/22   Manual therapy: B UT region STM in sitting (left side more tender but right side  tighter)   TherEx:  Performed HEP provided by MD from previous visit (Requested patient to bring handout next session to scan into record)   Supine AROM (supine- minimal cervical flex/ext/rotation/SB x approx 20 reps Supine cervical rotation x 12 with 10 sec hold bil Supine "Clock face" cervical circles x 15 CW and x15 CCW  Supine Head lift x 12 Supine chin retraction with hold 3 sec x 10  reps Lateral flexion in sitting  x 10 bil with 10 sec hold Supine shoulder flex AROM each UE x 15 reps Serratus punches supine each UE x 15 reps .  Scapular retraction AROM with hands in "W" 2 x 10 reps Reciprocal Shoulder flexion/extension x 12 with 2 sec hold Qped shoulder flexion/extension x 5 bil. Education for modified positioning on counter or back of chair if pressure on knees limits tolerance to qped.   Cues to remain in pain free range throughout movement. No pain reported with above therex today.  PATIENT EDUCATION:  Education details: Pt educated throughout session about proper posture and technique with exercises. Improved exercise technique, movement at target joints, use of target muscles after min to mod verbal, visual, tactile cues. goals, POC, HEP  Person educated: Patient Education method: Explanation, Demonstration, Tactile cues, Verbal cues, and Handouts Education comprehension: verbalized understanding, returned demonstration, verbal cues required, and tactile cues required   Manual:  Upper trap massage bilateral  x 11 minutes total   HOME EXERCISE PROGRAM: Access Code: [email or web address] URL: [email or web address] Date: 12/02/2022 Prepared by: Precious Bard  Exercises - Seated Scapular Retraction  - 1 x daily - 7 x weekly - 2 sets - 10 reps - 5 hold  ASSESSMENT:  CLINICAL IMPRESSION: Continued with painfree treatment focusing on active ROM and loosening tension in B UT. He is responding well to all therex- denying any pain during session except from right shoulder tightness with shoulder flexion. He is progressing well and will re-measure next visit. Patient will benefit from skilled physical therapy to reduce pain, improve ROM, and return to PLOF.   OBJECTIVE IMPAIRMENTS: decreased activity tolerance, decreased mobility, decreased ROM, decreased strength, hypomobility, increased fascial restrictions, impaired flexibility, impaired sensation, impaired UE functional use, improper body  mechanics, postural dysfunction, and pain.   ACTIVITY LIMITATIONS: carrying, lifting, bending, standing, squatting, sleeping, transfers, bed mobility, dressing, reach over head, hygiene/grooming, and caring for others  PARTICIPATION LIMITATIONS: meal prep, cleaning, laundry, driving, shopping, community activity, yard work, school, and church  PERSONAL FACTORS: Age, Time since onset of injury/illness/exacerbation, and 3+ comorbidities: arthritis, asthma, collagen vascular disease, diabetes, slipped disc L3-4, HLD, HTN, neuropathy  are also affecting patient's functional outcome.   REHAB POTENTIAL: Good  CLINICAL DECISION MAKING: Stable/uncomplicated  EVALUATION COMPLEXITY: Low   GOALS: Goals reviewed with patient? Yes  SHORT TERM GOALS: Target date: 12/16/2022    Patient will be independent in home exercise program to improve strength/mobility for better functional independence with ADLs. Baseline: 3/11: HEP given  Goal status: INITIAL    LONG TERM GOALS: Target date: 01/27/2023    Patient will increase FOTO score to equal to or greater than  56   to demonstrate statistically significant improvement in mobility and quality of life Baseline: 47% Goal status: INITIAL  2.  Patient will reduce Neck Disability Index score to <10% to demonstrate minimal disability with ADL's including improved sleeping tolerance, sitting tolerance, etc for better mobility at home and work. Baseline: 3/11: 40% Goal status: INITIAL  3.  Patient will increase cervical ROM within 10  degrees of standard range to return to driving, functional movement.  Baseline: 3/11: see above Goal status: INITIAL  4.  Patient will return to full gym program without restrictions.  Baseline: 3/11: limited by restrictions Goal status: INITIAL   PLAN:  PT FREQUENCY: 2x/week  PT DURATION: 8 weeks  PLANNED INTERVENTIONS: Therapeutic exercises, Therapeutic activity, Neuromuscular re-education, Balance training,  Gait training, Patient/Family education, Self Care, Joint mobilization, Stair training, Vestibular training, Canalith repositioning, DME instructions, Dry Needling, Electrical stimulation, Spinal mobilization, Cryotherapy, Moist heat, Compression bandaging, scar mobilization, Splintting, Taping, Ultrasound, Manual therapy, and Re-evaluation  PLAN FOR NEXT SESSION: AROM UE ROM, cervical ROM in pain free range, scar tissue massage, posture strengthening, stabilization -Patient to bring in handout from MD to scan into record.   Lenda Kelp, PT 12/27/2022, 11:24 AM

## 2023-01-01 ENCOUNTER — Ambulatory Visit: Payer: Medicare Other

## 2023-01-01 DIAGNOSIS — M542 Cervicalgia: Secondary | ICD-10-CM | POA: Diagnosis not present

## 2023-01-01 DIAGNOSIS — R293 Abnormal posture: Secondary | ICD-10-CM

## 2023-01-14 ENCOUNTER — Ambulatory Visit: Payer: Medicare Other | Admitting: Physical Therapy

## 2023-01-14 DIAGNOSIS — M542 Cervicalgia: Secondary | ICD-10-CM | POA: Diagnosis not present

## 2023-01-14 DIAGNOSIS — R293 Abnormal posture: Secondary | ICD-10-CM

## 2023-01-14 NOTE — Therapy (Signed)
OUTPATIENT PHYSICAL THERAPY CERVICAL TREATMENT   Patient Name: Alejandro Mendoza MRN: [id number] DOB:28-Apr-1956, 67 y.o., male Today's Date: 01/14/2023  END OF SESSION:  PT End of Session - 01/14/23 1506     Visit Number 9    Number of Visits 16    Date for PT Re-Evaluation 01/27/23    Authorization Type 3/10 eval 12/02/22    Progress Note Due on Visit 10    PT Start Time 1519    PT Stop Time 1601    PT Time Calculation (min) 42 min    Equipment Utilized During Treatment Cervical collar    Activity Tolerance Patient tolerated treatment well    Behavior During Therapy WFL for tasks assessed/performed                Past Medical History:  Diagnosis Date   Arthritis    Asthma    ENVIRONMENTAL IN 2000-ALBUTEROL PRN   Collagen vascular disease    Complication of anesthesia    SEVERE HEARTBURN   Diabetes    Disc displacement, lumbar    SLIPPED DISC L3-4   Hyperlipidemia    Hypertension    Inflammatory arthritis    Neuropathy    Slipped disc in neck    Past Surgical History:  Procedure Laterality Date   ANTERIOR CERVICAL DECOMP/DISCECTOMY FUSION N/A 11/04/2022   Procedure: C3-6 INSTRUMENTATION, C4 CORPECTOMY, C5-6 ANTERIOR CERVICAL DISCECTOMY AND FUSION;  Surgeon: Venetia Night, MD;  Location: [facility];  Service: Neurosurgery;  Laterality: N/A;   COLONOSCOPY  2019   CONTINUOUS NERVE MONITORING N/A 06/10/2018   Procedure: FACIAL NERVE MONITORING;  Surgeon: Geanie Logan, MD;  Location: [facility];  Service: ENT;  Laterality: N/A;   CYST EXCISION  1969   FROM INSIDE IN JAW   EPIDURAL BLOCK INJECTION     SALIVARY STONE REMOVAL Left 06/10/2018   Procedure: SALIVARY STONE REMOVAL/SIALODUCOPLASTY;  Surgeon: Geanie Logan, MD;  Location: [facility];  Service: ENT;  Laterality: Left;   SUBMANDIBULAR GLAND EXCISION Left 06/10/2018   Procedure: EXCISION SUBMANDIBULAR GLAND;  Surgeon: Geanie Logan, MD;  Location: [facility];  Service: ENT;  Laterality: Left;   Patient Active  Problem List   Diagnosis Date Noted   Cervical myelopathy 11/04/2022   Myelomalacia 11/04/2022   Cervical spinal stenosis 11/04/2022   Cervical radiculopathy 11/04/2022    PCP: Gracelyn Nurse MD  REFERRING PROVIDER: Susanne Borders PA  REFERRING DIAG: s/p ACDF, cervical myelopathy  THERAPY DIAG:  Cervicalgia  Abnormal posture  Rationale for Evaluation and Treatment: Rehabilitation  ONSET DATE: 11/04/22  SUBJECTIVE:  SUBJECTIVE STATEMENT: Pt reports that he is doing well, has some aches in the shoulders and in the neck, worse on L in Cervical region and Bil shoulder/UT   PERTINENT HISTORY:  Patient presents to PT for cervical myelopathy s/p ACDF on 11/04/22 for C3-6. Marland Kitchen Patient did have home health PT. Restrictions include not lifting >10 lb until 6 weeks post op; then increase to  25 lb after 12 weeks. Patient has to wear J collar for 6 weeks. Sleeping, eating, and sitting down he can take it off.  Prior to surgery patient underwent physical therapy at [facility]. Patient had numbness in index finger and thumb, then had the whole right arm go numb.  PMH arthritis, asthma, collagen vascular disease, diabetes, slipped disc L3-4, HLD, HTN, neuropathy. Active baseline, goes to the gym, plays bass guitar.     Pt has been cleared for AROM with exercises provided by MD at 6 week follow-up appointment. Pt reports that MD still wants pt to remain in C-collar for commnuity mobility until 12 week follow up.    PAIN:  Are you having pain? Yes: NPRS scale: 1/10 Pain location: neck Pain description: stiffness, aching in the morning Aggravating factors: first waking up Relieving factors: Pain medicine   PRECAUTIONS: Cervical  WEIGHT BEARING RESTRICTIONS: Yes Restrictions include not  lifting >10 lb until 6 weeks post op; then increase to  25 lb after 12 weeks.  FALLS:  Has patient fallen in last 6 months? No  LIVING ENVIRONMENT: Lives with: lives with their spouse Lives in: House/apartment Stairs: No Has following equipment at home: Single point cane and Environmental consultant - 2 wheeled  OCCUPATION: retired  PLOF: Independent  PATIENT GOALS: to get back to normal   NEXT MD VISIT: 01/23/2023   OBJECTIVE:   DIAGNOSTIC FINDINGS:  IMPRESSION: 1. C4 corpectomy and C3-C6 anterior cervical fusion.  PATIENT SURVEYS:  NDI 40% FOTO 47%  COGNITION: Overall cognitive status: Within functional limits for tasks assessed  SENSATION: Light touch: Impaired ; slight difference on the R  POSTURE: rounded shoulders and forward head  PALPATION: Surgical scar anterior neck    CERVICAL ROM:   Active ROM   A/PROM (deg) Eval  12/02/22  Flexion 10  Extension 11  Right lateral flexion 5  Left lateral flexion 10  Right rotation 10  Left rotation 8   (Blank rows = not tested)  UPPER EXTREMITY ROM:  Active ROM Right eval Left eval  Shoulder flexion 83 708  Shoulder extension [facility] WFL  Shoulder abduction [facility]  Shoulder adduction     (Blank rows = not tested)  UPPER EXTREMITY MMT:  *deferred due to precautions   CERVICAL SPECIAL TESTS:  *deferred due to precaution    TODAY'S TREATMENT:                                                                                                                              DATE: 01/14/23   Manual  therapy: B cervical paraspinals and B UT region STM in sitting (left side more tender but right side tighter)   TherEx:  Performed HEP provided by MD from previous visit (Patient brought in his MD prescribed HEP to scan into record)    Seated Scapular retraction AROM with hands in "W" 2 x 10 reps Seated posterior Shoulder rolls (2 sets of 10 reps)  Seated UT stretch- 5 sec hold x 5.   Seated  shoulder ext with RTB 2 sets of 10  reps Seated scap rows with GTB 2 sets of 10 reps  Supine cervical rotation x 12 with 10 sec hold bil Supine "Clock face" cervical circles x 12 CW and x12 CCW  Supine Head lift x 12 Supine chin retraction with hold 3 sec x 10  reps Supine shoulder flex AROM each UE x 15 reps Serratus punches supine each UE x 15 reps .  Quadraped shoulder flexion/extension x 5 bil.  Quadraped head flex/chin tuck hold 5 sec x 10 reps Qped LE extension x 8 bil.   Cues to remain in pain free range throughout movement. No pain reported with above therex today.  PATIENT EDUCATION:  Education details: Pt educated throughout session about proper posture and technique with exercises. Improved exercise technique, movement at target joints, use of target muscles after min to mod verbal, visual, tactile cues. goals, POC, HEP  Person educated: Patient Education method: Explanation, Demonstration, Tactile cues, Verbal cues, and Handouts Education comprehension: verbalized understanding, returned demonstration, verbal cues required, and tactile cues required   Manual:  Upper trap massage bilateral  x 11 minutes total   HOME EXERCISE PROGRAM: Access Code: [email or web address] URL: [email or web address] Date: 12/02/2022 Prepared by: Precious Bard  Exercises - Seated Scapular Retraction  - 1 x daily - 7 x weekly - 2 sets - 10 reps - 5 hold  ASSESSMENT:  CLINICAL IMPRESSION: Patient continues to progress with cervical and Postural ROM/strengthening. Put forth good ewffort throughout all therapeutic activities in PT session. Able to tolerate increased resistance and responding will to improved mechanics of movement to reduce UT activation. Mild reduction In UT tightness following manual therapy. He is progressing well overall with pain relief/ROM/strength. Patient will benefit from skilled physical therapy to reduce pain, improve ROM, and return to PLOF.   OBJECTIVE IMPAIRMENTS: decreased activity tolerance,  decreased mobility, decreased ROM, decreased strength, hypomobility, increased fascial restrictions, impaired flexibility, impaired sensation, impaired UE functional use, improper body mechanics, postural dysfunction, and pain.   ACTIVITY LIMITATIONS: carrying, lifting, bending, standing, squatting, sleeping, transfers, bed mobility, dressing, reach over head, hygiene/grooming, and caring for others  PARTICIPATION LIMITATIONS: meal prep, cleaning, laundry, driving, shopping, community activity, yard work, school, and church  PERSONAL FACTORS: Age, Time since onset of injury/illness/exacerbation, and 3+ comorbidities: arthritis, asthma, collagen vascular disease, diabetes, slipped disc L3-4, HLD, HTN, neuropathy  are also affecting patient's functional outcome.   REHAB POTENTIAL: Good  CLINICAL DECISION MAKING: Stable/uncomplicated  EVALUATION COMPLEXITY: Low   GOALS: Goals reviewed with patient? Yes  SHORT TERM GOALS: Target date: 12/16/2022    Patient will be independent in home exercise program to improve strength/mobility for better functional independence with ADLs. Baseline: 3/11: HEP given  Goal status: INITIAL    LONG TERM GOALS: Target date: 01/27/2023    Patient will increase FOTO score to equal to or greater than  56   to demonstrate statistically significant improvement in mobility and quality of life Baseline: 47% Goal status: INITIAL  2.  Patient will reduce Neck  Disability Index score to <10% to demonstrate minimal disability with ADL's including improved sleeping tolerance, sitting tolerance, etc for better mobility at home and work. Baseline: 3/11: 40% Goal status: INITIAL  3.  Patient will increase cervical ROM within 10 degrees of standard range to return to driving, functional movement.  Baseline: 3/11: see above Goal status: INITIAL  4.  Patient will return to full gym program without restrictions.  Baseline: 3/11: limited by restrictions Goal status:  INITIAL   PLAN:  PT FREQUENCY: 2x/week  PT DURATION: 8 weeks  PLANNED INTERVENTIONS: Therapeutic exercises, Therapeutic activity, Neuromuscular re-education, Balance training, Gait training, Patient/Family education, Self Care, Joint mobilization, Stair training, Vestibular training, Canalith repositioning, DME instructions, Dry Needling, Electrical stimulation, Spinal mobilization, Cryotherapy, Moist heat, Compression bandaging, scar mobilization, Splintting, Taping, Ultrasound, Manual therapy, and Re-evaluation  PLAN FOR NEXT SESSION:   AROM UE ROM, cervical ROM in pain free range, scar tissue massage, posture strengthening, stabilization   Golden Pop, PT 01/14/2023, 4:09 PM

## 2023-01-16 ENCOUNTER — Ambulatory Visit: Payer: Medicare Other | Admitting: Physical Therapy

## 2023-01-16 DIAGNOSIS — M542 Cervicalgia: Secondary | ICD-10-CM

## 2023-01-16 DIAGNOSIS — R293 Abnormal posture: Secondary | ICD-10-CM

## 2023-01-16 NOTE — Therapy (Signed)
OUTPATIENT PHYSICAL THERAPY CERVICAL TREATMENT/  PHYSICAL THERAPY PROGRESS NOTE   Dates of reporting period  12/02/2022   to   01/16/2023     Patient Name: Alejandro Mendoza MRN: [id number] DOB:08-Jul-1956, 67 y.o., male Today's Date: 01/16/2023  END OF SESSION:  PT End of Session - 01/16/23 0937     Visit Number 10    Number of Visits 16    Date for PT Re-Evaluation 01/27/23    Authorization Type 3/10 eval 12/02/22    Progress Note Due on Visit 10    PT Start Time 0935    Equipment Utilized During Treatment Cervical collar    Activity Tolerance Patient tolerated treatment well    Behavior During Therapy WFL for tasks assessed/performed                Past Medical History:  Diagnosis Date   Arthritis    Asthma    ENVIRONMENTAL IN 2000-ALBUTEROL PRN   Collagen vascular disease    Complication of anesthesia    SEVERE HEARTBURN   Diabetes    Disc displacement, lumbar    SLIPPED DISC L3-4   Hyperlipidemia    Hypertension    Inflammatory arthritis    Neuropathy    Slipped disc in neck    Past Surgical History:  Procedure Laterality Date   ANTERIOR CERVICAL DECOMP/DISCECTOMY FUSION N/A 11/04/2022   Procedure: C3-6 INSTRUMENTATION, C4 CORPECTOMY, C5-6 ANTERIOR CERVICAL DISCECTOMY AND FUSION;  Surgeon: Venetia Night, MD;  Location: [facility];  Service: Neurosurgery;  Laterality: N/A;   COLONOSCOPY  2019   CONTINUOUS NERVE MONITORING N/A 06/10/2018   Procedure: FACIAL NERVE MONITORING;  Surgeon: Geanie Logan, MD;  Location: [facility];  Service: ENT;  Laterality: N/A;   CYST EXCISION  1969   FROM INSIDE IN JAW   EPIDURAL BLOCK INJECTION     SALIVARY STONE REMOVAL Left 06/10/2018   Procedure: SALIVARY STONE REMOVAL/SIALODUCOPLASTY;  Surgeon: Geanie Logan, MD;  Location: [facility];  Service: ENT;  Laterality: Left;   SUBMANDIBULAR GLAND EXCISION Left 06/10/2018   Procedure: EXCISION SUBMANDIBULAR GLAND;  Surgeon: Geanie Logan, MD;  Location: [facility];  Service: ENT;   Laterality: Left;   Patient Active Problem List   Diagnosis Date Noted   Cervical myelopathy 11/04/2022   Myelomalacia 11/04/2022   Cervical spinal stenosis 11/04/2022   Cervical radiculopathy 11/04/2022    PCP: Gracelyn Nurse MD  REFERRING PROVIDER: Susanne Borders PA  REFERRING DIAG: s/p ACDF, cervical myelopathy  THERAPY DIAG:  Abnormal posture  Cervicalgia  Rationale for Evaluation and Treatment: Rehabilitation  ONSET DATE: 11/04/22  SUBJECTIVE:  SUBJECTIVE STATEMENT: Pt reports that he is doing well, has some aches in the shoulders and in the neck, worse on L in Cervical region and Bil shoulder/UT   PERTINENT HISTORY:  Patient presents to PT for cervical myelopathy s/p ACDF on 11/04/22 for C3-6. Marland Kitchen Patient did have home health PT. Restrictions include not lifting >10 lb until 6 weeks post op; then increase to  25 lb after 12 weeks. Patient has to wear J collar for 6 weeks. Sleeping, eating, and sitting down he can take it off.  Prior to surgery patient underwent physical therapy at [facility]. Patient had numbness in index finger and thumb, then had the whole right arm go numb.  PMH arthritis, asthma, collagen vascular disease, diabetes, slipped disc L3-4, HLD, HTN, neuropathy. Active baseline, goes to the gym, plays bass guitar.     Pt has been cleared for AROM with exercises provided by MD at 6 week follow-up appointment. Pt reports that MD still wants pt to remain in C-collar for commnuity mobility until 12 week follow up.    PAIN:  Are you having pain? Yes: NPRS scale: 1/10 Pain location: neck Pain description: stiffness, aching in the morning Aggravating factors: first waking up Relieving factors: Pain medicine   PRECAUTIONS: Cervical  WEIGHT BEARING  RESTRICTIONS: Yes Restrictions include not lifting >10 lb until 6 weeks post op; then increase to  25 lb after 12 weeks.  FALLS:  Has patient fallen in last 6 months? No  LIVING ENVIRONMENT: Lives with: lives with their spouse Lives in: House/apartment Stairs: No Has following equipment at home: Single point cane and Environmental consultant - 2 wheeled  OCCUPATION: retired  PLOF: Independent  PATIENT GOALS: to get back to normal   NEXT MD VISIT: 01/23/2023   OBJECTIVE:   DIAGNOSTIC FINDINGS:  IMPRESSION: 1. C4 corpectomy and C3-C6 anterior cervical fusion.  PATIENT SURVEYS:  NDI 40% FOTO 47%. 4/25: 58   COGNITION: Overall cognitive status: Within functional limits for tasks assessed  SENSATION: Light touch: Impaired ; slight difference on the R  POSTURE: rounded shoulders and forward head  PALPATION: Surgical scar anterior neck    CERVICAL ROM:   Active ROM   A/PROM (deg) Eval  12/02/22 A/PROM (deg)  Flexion 10 33  Extension 11 25  Right lateral flexion 5 25  Left lateral flexion 10 28  Right rotation 10 40  Left rotation 8 48   (Blank rows = not tested)  UPPER EXTREMITY ROM:  Active ROM Right eval Left eval Right 4/25 Left 4/25  Shoulder flexion 83 08 145 143  Shoulder extension [facility] WFL  Shoulder abduction [facility] WFL  Shoulder adduction       (Blank rows = not tested)  UPPER EXTREMITY MMT:  *deferred due to precautions   CERVICAL SPECIAL TESTS:  *deferred due to precaution    TODAY'S TREATMENT:  DATE: 01/16/23  Cervical and shoulder ROM assessed for progress towards LTG. See above.  Pt completed Foto.   Manual therapy: B cervical paraspinals and B UT region STM in sitting (left side more tender but right side tighter). Prone sub occipital release x 2 min. UT STM and trigger point release x 3 min bil. Prone UT stretch  in pain free range.      TherEx:  Seated UT stretch- 5 sec hold x 5.   Seated  shoulder ext with RTB 2 sets of 10 reps Seated scap rows with GTB 2 sets of 10 reps  Supine cervical rotation x 12 with 10 sec hold bil Supine Head lift x 12 Supine chin retraction with hold 3 sec x 10  reps Serratus punches supine each UE x 15 reps .   Cues to remain in pain free range throughout movement. No pain reported with above therex today.  PATIENT EDUCATION:  Education details: Pt educated throughout session about proper posture and technique with exercises. Improved exercise technique, movement at target joints, use of target muscles after min to mod verbal, visual, tactile cues. goals, POC, HEP  Person educated: Patient Education method: Explanation, Demonstration, Tactile cues, Verbal cues, and Handouts Education comprehension: verbalized understanding, returned demonstration, verbal cues required, and tactile cues required   Manual:  Upper trap massage bilateral  x 11 minutes total   HOME EXERCISE PROGRAM: Access Code: [email or web address] URL: [email or web address] Date: 12/02/2022 Prepared by: Precious Bard  Exercises - Seated Scapular Retraction  - 1 x daily - 7 x weekly - 2 sets - 10 reps - 5 hold  ASSESSMENT:  CLINICAL IMPRESSION: Patient continues to progress with cervical and Postural ROM/strengthening. Put forth good effort throughout all therapeutic activities in PT session. Increased motion with reduced pain in cervical spine as noted above. Pt also demonstrates improved improved FOTO score by 11 points meeting goals for ROM and perceived impairment. Mild reduction In UT tightness following manual therapy. Patient's condition has the potential to improve in response to therapy. Maximum improvement is yet to be obtained. The anticipated improvement is attainable and reasonable in a generally predictable time. Patient will benefit from skilled physical therapy to reduce  pain, improve ROM, and return to PLOF. Pt agreeable to continue skilled PT for additional 3-4 weeks to allow improved functional and reduced pain once cervical restrictions are reduced.   OBJECTIVE IMPAIRMENTS: decreased activity tolerance, decreased mobility, decreased ROM, decreased strength, hypomobility, increased fascial restrictions, impaired flexibility, impaired sensation, impaired UE functional use, improper body mechanics, postural dysfunction, and pain.   ACTIVITY LIMITATIONS: carrying, lifting, bending, standing, squatting, sleeping, transfers, bed mobility, dressing, reach over head, hygiene/grooming, and caring for others  PARTICIPATION LIMITATIONS: meal prep, cleaning, laundry, driving, shopping, community activity, yard work, school, and church  PERSONAL FACTORS: Age, Time since onset of injury/illness/exacerbation, and 3+ comorbidities: arthritis, asthma, collagen vascular disease, diabetes, slipped disc L3-4, HLD, HTN, neuropathy  are also affecting patient's functional outcome.   REHAB POTENTIAL: Good  CLINICAL DECISION MAKING: Stable/uncomplicated  EVALUATION COMPLEXITY: Low   GOALS: Goals reviewed with patient? Yes  SHORT TERM GOALS: Target date: 12/16/2022    Patient will be independent in home exercise program to improve strength/mobility for better functional independence with ADLs. Baseline: 3/11: HEP given  Goal status: IN PROGRESS    LONG TERM GOALS: Target date: 01/27/2023    Patient will increase FOTO score to equal to or greater than  56   to demonstrate statistically significant improvement in mobility and  quality of life Baseline: 47% 4/25. 58 Goal status: INITIAL  2.  Patient will reduce Neck Disability Index score to <10% to demonstrate minimal disability with ADL's including improved sleeping tolerance, sitting tolerance, etc for better mobility at home and work. Baseline: 3/11: 40% Goal status: INITIAL  3.  Patient will increase cervical ROM  within 10 degrees of standard range to return to driving, functional movement.  Baseline: 3/11: see above Goal status: MET  4.  Patient will return to full gym program without restrictions.  Baseline: 3/11: limited by restrictions Goal status: IN PROGRESS   PLAN:  PT FREQUENCY: 2x/week  PT DURATION: 8 weeks  PLANNED INTERVENTIONS: Therapeutic exercises, Therapeutic activity, Neuromuscular re-education, Balance training, Gait training, Patient/Family education, Self Care, Joint mobilization, Stair training, Vestibular training, Canalith repositioning, DME instructions, Dry Needling, Electrical stimulation, Spinal mobilization, Cryotherapy, Moist heat, Compression bandaging, scar mobilization, Splintting, Taping, Ultrasound, Manual therapy, and Re-evaluation  PLAN FOR NEXT SESSION:   AROM UE ROM, cervical ROM in pain free range, scar tissue massage, posture strengthening, stabilization.    Golden Pop, PT 01/16/2023, 9:49 AM

## 2023-01-22 ENCOUNTER — Other Ambulatory Visit: Payer: Self-pay

## 2023-01-22 DIAGNOSIS — G959 Disease of spinal cord, unspecified: Secondary | ICD-10-CM

## 2023-01-23 ENCOUNTER — Ambulatory Visit (INDEPENDENT_AMBULATORY_CARE_PROVIDER_SITE_OTHER): Payer: Medicare Other | Admitting: Neurosurgery

## 2023-01-23 ENCOUNTER — Encounter: Payer: Self-pay | Admitting: Neurosurgery

## 2023-01-23 ENCOUNTER — Ambulatory Visit
Admission: RE | Admit: 2023-01-23 | Discharge: 2023-01-23 | Disposition: A | Discharge status: Home / Self Care | Payer: Medicare Other | Source: Ambulatory Visit | Attending: Neurosurgery | Admitting: Neurosurgery

## 2023-01-23 ENCOUNTER — Encounter: Payer: Medicare Other | Admitting: Physical Therapy

## 2023-01-23 VITALS — BP 133/78 | HR 65 | Temp 99.0°F | Ht 75.0 in | Wt 250.0 lb

## 2023-01-23 DIAGNOSIS — Z09 Encounter for follow-up examination after completed treatment for conditions other than malignant neoplasm: Secondary | ICD-10-CM

## 2023-01-23 DIAGNOSIS — G959 Disease of spinal cord, unspecified: Secondary | ICD-10-CM

## 2023-01-23 DIAGNOSIS — M5412 Radiculopathy, cervical region: Secondary | ICD-10-CM

## 2023-01-23 DIAGNOSIS — M4802 Spinal stenosis, cervical region: Secondary | ICD-10-CM

## 2023-01-23 DIAGNOSIS — Z981 Arthrodesis status: Secondary | ICD-10-CM

## 2023-01-23 NOTE — Progress Notes (Signed)
   REFERRING PHYSICIAN:  Gracelyn Nurse, Md [address] [place],  [place] [address]  DOS: 11/04/22 C3-6 instrumentation, C4 corpectomy, C5-6 ACDF  HISTORY OF PRESENT ILLNESS: Alejandro Mendoza is approximately 3 months status post cervical fusion. Overall, he is doing well.  He continues to have some neck pain as well as numbness in his bilateral hands worse on the right than the left.  But he states that both of these things have improved since surgery.  He also has appreciated some difficulty with swallowing and feeling like a intermittent postnasal drip.  This is also improved since surgery but has not resolved.  12/17/22  Overall, he is doing well postoperatively.  His swallowing has gotten better.  His balance is much better than it was prior to surgery.  He is very happy with that.    PHYSICAL EXAMINATION:  NEUROLOGICAL:  General: In no acute distress.   Awake, alert, oriented to person, place, and time.  Pupils equal round and reactive to light.  Facial tone is symmetric.  Tongue protrusion is midline.  There is no pronator drift.  Strength: Side Biceps Triceps Deltoid Interossei Grip Wrist Ext. Wrist Flex.  R 5 5 5 5 5 5 5   L 5 5 5 5 5 5 5    Incision c/d/I  Imaging:  01/23/23 cervical xrays Show intact hardware without evidence of hardware malfunction.  Assessment / Plan: COLLIER BOHNET is doing well after cervical decompression and fusion.  He is doing well with improvements in his balance and walking.  He does continue to have some residual symptoms however these are improving.  Should he continue to have trouble swallowing, we discussed eventually getting a further eval at his next visit.  I encouraged him to continue to give the numbness time as this can take up to a year to year and a half to improve.  We discussed his neck pain and I offered him referral for dry needling or trigger point injections however he does not feel that he needs these at this time and would like  to continue with home exercises and giving it time.  he will return to clinic in approximately 3 months to see Dr. Myer Haff with cervical x-rays prior or sooner should he have any questions or concerns.    Advised to contact the office if any questions or concerns arise.   I spent a total of 20 minutes in both face-to-face and non-face-to-face activities for this visit on the date of this encounter including review of imaging, review of symptoms, discussion of plan of care, and documentation.   Manning Charity PA-C Dept of Neurosurgery

## 2023-01-28 ENCOUNTER — Ambulatory Visit: Payer: Medicare Other | Admitting: Physical Therapy

## 2023-01-30 ENCOUNTER — Ambulatory Visit: Payer: Medicare Other | Admitting: Physical Therapy

## 2023-02-06 ENCOUNTER — Encounter: Payer: Medicare Other | Admitting: Physical Therapy

## 2023-02-11 ENCOUNTER — Encounter: Payer: Medicare Other | Admitting: Physical Therapy

## 2023-02-13 ENCOUNTER — Encounter: Payer: Medicare Other | Admitting: Physical Therapy

## 2023-02-18 ENCOUNTER — Encounter: Payer: Medicare Other | Admitting: Physical Therapy

## 2023-02-20 ENCOUNTER — Encounter: Payer: Medicare Other | Admitting: Physical Therapy

## 2023-02-25 ENCOUNTER — Encounter: Payer: Medicare Other | Admitting: Physical Therapy

## 2023-02-27 ENCOUNTER — Encounter: Payer: Medicare Other | Admitting: Physical Therapy

## 2023-03-04 ENCOUNTER — Encounter: Payer: Medicare Other | Admitting: Physical Therapy

## 2023-03-06 ENCOUNTER — Encounter: Payer: Medicare Other | Admitting: Physical Therapy

## 2023-03-10 DIAGNOSIS — Z981 Arthrodesis status: Secondary | ICD-10-CM

## 2023-03-10 NOTE — Telephone Encounter (Signed)
DOS: 11/04/22 C3-6 instrumentation, C4 corpectomy, C5-6 ACDF   Called patient to get more information. He had severe post nasal drip right after surgery- it ws severe. This slowly improved and then when away in May. A few weeks ago, he notes the post nasal drip is coming back. No problems swallowing, but he has rough, scratchy throat.   Will review with Dr. Myer Haff. May consider referral to ENT?  Will send him a message after I talk with Dr. Myer Haff.

## 2023-03-12 NOTE — Addendum Note (Signed)
Addended byDrake Leach on: 03/12/2023 07:57 AM   Modules accepted: Orders

## 2023-03-12 NOTE — Telephone Encounter (Signed)
Referral done to [facility] in [place], Dr. Leory Plowman.

## 2023-03-13 ENCOUNTER — Encounter: Payer: Medicare Other | Admitting: Physical Therapy

## 2023-03-18 ENCOUNTER — Encounter: Payer: Medicare Other | Admitting: Physical Therapy

## 2023-03-20 ENCOUNTER — Encounter: Payer: Medicare Other | Admitting: Physical Therapy

## 2023-03-24 ENCOUNTER — Encounter (INDEPENDENT_AMBULATORY_CARE_PROVIDER_SITE_OTHER): Payer: Self-pay | Admitting: Otolaryngology

## 2023-03-24 ENCOUNTER — Ambulatory Visit (INDEPENDENT_AMBULATORY_CARE_PROVIDER_SITE_OTHER): Payer: Medicare Other | Admitting: Otolaryngology

## 2023-03-24 VITALS — BP 148/74 | HR 58 | Ht 75.0 in | Wt 265.0 lb

## 2023-03-24 DIAGNOSIS — J302 Other seasonal allergic rhinitis: Secondary | ICD-10-CM

## 2023-03-24 DIAGNOSIS — R0981 Nasal congestion: Secondary | ICD-10-CM | POA: Diagnosis not present

## 2023-03-24 DIAGNOSIS — R1310 Dysphagia, unspecified: Secondary | ICD-10-CM

## 2023-03-24 DIAGNOSIS — K1120 Sialoadenitis, unspecified: Secondary | ICD-10-CM

## 2023-03-24 DIAGNOSIS — J370 Chronic laryngitis: Secondary | ICD-10-CM

## 2023-03-24 DIAGNOSIS — R682 Dry mouth, unspecified: Secondary | ICD-10-CM

## 2023-03-24 DIAGNOSIS — R09A2 Foreign body sensation, throat: Secondary | ICD-10-CM

## 2023-03-24 DIAGNOSIS — K115 Sialolithiasis: Secondary | ICD-10-CM

## 2023-03-24 DIAGNOSIS — K219 Gastro-esophageal reflux disease without esophagitis: Secondary | ICD-10-CM | POA: Diagnosis not present

## 2023-03-24 MED ORDER — CETIRIZINE HCL 10 MG PO TABS: 10.0000 mg | ORAL_TABLET | Freq: Every day | ORAL | 11 refills | Status: DC

## 2023-03-24 NOTE — Progress Notes (Signed)
ENT CONSULT:  Reason for Consult: globus sensation and throat discomfort/mucus    Referring Physician:  NSG, Dr Myer Haff   HPI: Alejandro Mendoza is an 67 y.o. male with hx ACDF left-sided approach, 11/04/22 C3-6 instrumentation, C4 corpectomy, C5-6 ACDF without complications, stayed overnight and went home the next day, here for evaluation of mild dysphagia and mucus in his throat.   He recalls that after anesthesia, he woke up with lots of mucus in his throat, and it improved overtime. He still has sensation of mucus in his throat at all times after the surgery. He denies significant swallowing issues and felt that he ate solid foods nearly right after the surgery, but he feels that swallowing is a bit more effortful. He denies solid food dysphagia. Denies coughing or choking on foods or fluids. At times pills are hard to swallow and it usually happens when his throat is dry. Denies hx of GERD/LPR, but was on reflux medication years ago for some time. He has seasonal allergies usually in Spring, and takes Benadryl PRN. On Flonase daily.  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 No voice or breathing issues.   Records Reviewed:  Susanne Borders, PA visit 01/23/2023 DOS: 11/04/22 C3-6 instrumentation, C4 corpectomy, C5-6 ACDF "3 months status post cervical fusion. Overall, he is doing well.  He continues to have some neck pain as well as numbness in his bilateral hands worse on the right than the left.  But he states that both of these things have improved since surgery.  He also has appreciated some difficulty with swallowing and feeling like a intermittent postnasal drip.  This is also improved since surgery but has not resolved."   12/17/22  Overall, he is doing well postoperatively.  His swallowing has gotten better.  His balance is much better than it was prior to surgery.  He is very happy with that.   Op Note by Dr Willeen Cass 06/10/2018  EXCISION LEFT SUBMANDIBULAR GLAND WITH TRANSORAL REMOVAL OF  SUBMANDIBULAR DUCT STONE     Past Medical History:  Diagnosis Date   Arthritis    Asthma    ENVIRONMENTAL IN 2000-ALBUTEROL PRN   Collagen vascular disease (HCC)    Complication of anesthesia    SEVERE HEARTBURN   Diabetes (HCC)    Disc displacement, lumbar    SLIPPED DISC L3-4   Hyperlipidemia    Hypertension    Inflammatory arthritis    Neuropathy    Slipped disc in neck     Past Surgical History:  Procedure Laterality Date   ANTERIOR CERVICAL DECOMP/DISCECTOMY FUSION N/A 11/04/2022   Procedure: C3-6 INSTRUMENTATION, C4 CORPECTOMY, C5-6 ANTERIOR CERVICAL DISCECTOMY AND FUSION;  Surgeon: Venetia Night, MD;  Location: [facility];  Service: Neurosurgery;  Laterality: N/A;   COLONOSCOPY  2019   CONTINUOUS NERVE MONITORING N/A 06/10/2018   Procedure: FACIAL NERVE MONITORING;  Surgeon: Geanie Logan, MD;  Location: [facility];  Service: ENT;  Laterality: N/A;   CYST EXCISION  1969   FROM INSIDE IN JAW   EPIDURAL BLOCK INJECTION     SALIVARY STONE REMOVAL Left 06/10/2018   Procedure: SALIVARY STONE REMOVAL/SIALODUCOPLASTY;  Surgeon: Geanie Logan, MD;  Location: [facility];  Service: ENT;  Laterality: Left;   SUBMANDIBULAR GLAND EXCISION Left 06/10/2018   Procedure: EXCISION SUBMANDIBULAR GLAND;  Surgeon: Geanie Logan, MD;  Location: [facility];  Service: ENT;  Laterality: Left;    No family history on file.  Social History:  reports that he quit smoking about  28 years ago. His smoking use included cigarettes. He has a 7.50 pack-year smoking history. He has never used smokeless tobacco. He reports that he does not currently use alcohol. He reports that he does not use drugs.  Allergies:  Allergies  Allergen Reactions   Antihistamines, Chlorpheniramine-Type Other (See Comments)    Allergies to antihistamines: caused Saliva stones   Aspartame Other (See Comments)    Other Reaction(s): Feeling faint  dizziness  Other Reaction(s): FAINTNESS   Lactose Other (See Comments)     GI UPSET   Lactose Intolerance (Gi) Other (See Comments)    GI UPSET   Methotrexate Other (See Comments)    Affected immune system   Nsaids Other (See Comments)    GI BLEED with Motrin   Other Other (See Comments)    Allergy Test: Grass and Mold- runny nose, sneezing, red eyes   Sucralose Other (See Comments)    dizziness   Sulfa Antibiotics     Light headed, dizziness   Sulfamethoxazole Other (See Comments)    Light headedness and dizziness   Zonisamide Other (See Comments)    Member doesn't remember reaction   Hydroxychloroquine Other (See Comments), Rash and Swelling    Fever/"burning up"  Other Reaction(s): Eruption of skin, Dizziness, Swelling, Eruption of skin, Dizziness, Swelling, Eruption of skin, Dizziness, Swelling   Hydroxychloroquine Sulfate Hives and Rash   Lisinopril Swelling, Hives and Rash    Facial swelling  Facial swelling  Lip swelling    Medications: I have reviewed the patient's current medications.   The PMH, PSH, Medications, Allergies, and SH were reviewed and updated.  ROS: Constitutional: Negative for fever, weight loss and weight gain. Cardiovascular: Negative for chest pain and dyspnea on exertion. Respiratory: Is not experiencing shortness of breath at rest. Gastrointestinal: Negative for nausea and vomiting. Neurological: Negative for headaches. Psychiatric: The patient is not nervous/anxiou  Blood pressure (!) 148/74, pulse (!) 58, height 6\' 3"  (1.905 m), weight 265 lb (120.2 kg), SpO2 97 %.  PHYSICAL EXAM:  Exam: General: Well-developed, well-nourished Communication and Voice: low-pitch slightly raspy Respiratory Respiratory effort: Equal inspiration and expiration without stridor Cardiovascular Peripheral Vascular: Warm extremities with equal color/perfusion Eyes: No nystagmus with equal extraocular motion bilaterally Neuro/Psych/Balance: Patient oriented to person, place, and time; Appropriate mood and affect; Gait is intact with  no imbalance; Cranial nerves I-XII are intact Head and Face Inspection: Normocephalic and atraumatic without mass or lesion Palpation: Facial skeleton intact without bony stepoffs Salivary Glands: No mass or tenderness Facial Strength: Facial motility symmetric and full bilaterally ENT Pinna: External ear intact and fully developed External canal: Canal is patent with intact skin Tympanic Membrane: Clear and mobile External Nose: No scar or anatomic deformity Internal Nose: Septum intact and midline. No edema, polyp, or rhinorrhea Lips, Teeth, and gums: Mucosa and teeth intact and viable, thick saliva, no lesions or thrush  TMJ: No pain to palpation with full mobility Oral cavity/oropharynx: No erythema or exudate, no lesions present Nasopharynx: No mass or lesion with intact mucosa Hypopharynx: Intact mucosa without pooling of secretions Larynx Glottic: Full true vocal cord mobility without lesion or mass Supraglottic: Normal appearing epiglottis and AE folds, thick saliva and mucus throughout, no pooling of secretions in pyriforms Interarytenoid Space: Moderate pachydermia edema Subglottic Space: Patent without lesion or edema Neck Neck and Trachea: Midline trachea without mass or lesion, well-healed surgical incisions  Thyroid: No mass or nodularity Lymphatics: No lymphadenopathy  Procedure: Preoperative diagnosis: dysphagia, GERD, globus sensation   Postoperative  diagnosis:   Same  Procedure: Flexible fiberoptic laryngoscopy  Surgeon: Ashok Croon, MD  Anesthesia: Topical lidocaine and Afrin Complications: None Condition is stable throughout exam  Indications and consent:  The patient presents to the clinic with Indirect laryngoscopy view was incomplete. Thus it was recommended that they undergo a flexible fiberoptic laryngoscopy. All of the risks, benefits, and potential complications were reviewed with the patient preoperatively and verbal informed consent was  obtained.  Procedure: The patient was seated upright in the clinic. Topical lidocaine and Afrin were applied to the nasal cavity. After adequate anesthesia had occurred, I then proceeded to pass the flexible telescope into the nasal cavity. The nasal cavity was patent without rhinorrhea or polyp. The nasopharynx was also patent without mass or lesion. The base of tongue was visualized and was normal. There were no signs of pooling of secretions in the piriform sinuses. The true vocal folds were mobile bilaterally. There were no signs of glottic or supraglottic mucosal lesion or mass. There was moderate interarytenoid pachydermia and post cricoid edema. The telescope was then slowly withdrawn and the patient tolerated the procedure throughout.  Studies Reviewed:2019 CT neck with contrast FINDINGS: PHARYNX AND LARYNX: Normal.  Widely patent airway.   SALIVARY GLANDS: 7 mm LEFT submandibular sialolith. Mild inflammatory changes LEFT submental tubular duct with mild ductal dilatation, no mass. 5 mm round LEFT sublingual sialolith. Punctate LEFT parotid sialoliths.   THYROID: Normal.   LYMPH NODES: No lymphadenopathy by CT size criteria.   VASCULAR: Mild atherosclerosis carotid bifurcations.   LIMITED INTRACRANIAL: Normal.   VISUALIZED ORBITS: Normal.   MASTOIDS AND VISUALIZED PARANASAL SINUSES: LEFT maxillary mucosal retention cyst, associated LEFT maxillary periapical cyst.   SKELETON: Nonacute. Advanced degenerative changes cervical spine. Severe canal stenosis C3-4, moderate C4-5. Severe C3-4, C4-5 neural foraminal narrowing. Moderate to severe C5-6 neural foraminal narrowing.   UPPER CHEST: Lung apices are clear. Prominent though not pathologically enlarged superior mediastinal lymph nodes.   OTHER: Mildly thickened bilateral platysma. Mild LEFT neck effusion without focal fluid collection, subcutaneous gas or radiopaque foreign bodies.   IMPRESSION: 1. Acute LEFT submandibular  sialoadenitis. 2. LEFT sublingual, LEFT submandibular and LEFT parotid sialoliths. 3. 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 Severe C3-4 and C4-5 neural foraminal narrowing.    Assessment/Plan: Encounter Diagnoses  Name Primary?   Gastroesophageal reflux disease without esophagitis    Nasal congestion    Dysphagia, unspecified type Yes   Globus pharyngeus    Chronic nasal congestion    Dry mouth    Laryngitis sicca    Seasonal allergies    Sialoadenitis    Sialolithiasis    67 yoM hx of ACDF as above 10/2022 and prior hx of L SMG gland removal due to sialoadenitis and stones 2019, here for evaluation f mild dysphagia (mostly limited to pills) and sensation of mucus in his throat. Hx of seasonal allergies, takes Benadryl as needed, and on daily Flonase. Exam as above, with evidence of laryngitis sicca and thick mucus and saliva throughout supraglottis and in oropharynx, but no lesions masses, and full b/l VF movement. No pooling of secretions in pyriforms. I suspect his sx are multifactorial and could be related to PND/seasonal allergies, dry mouth (medication side effect or under-hydration), vs inflammation and increased mucus production from GERD/LPR   - MBS/esophagram - continue Flonase and try to switch to Zyrtec - increase hydration  - alginates and diet/lifestyle changes to minimize reflux  - RTC in 2 weeks after swallow study  Thank you for allowing  me to participate in the care of this patient. Please do not hesitate to contact me with any questions or concerns.   Ashok Croon, MD Otolaryngology [facility] Phone: [phone] Fax: [phone]    03/24/2023, 2:30 PM

## 2023-03-24 NOTE — Patient Instructions (Signed)
-   schedule swallow study - start Zyrtec and continue Flonase - diet changes and [facility]lux [employer] for reflux  - return after swallow study in 2 weeks

## 2023-03-25 ENCOUNTER — Encounter (INDEPENDENT_AMBULATORY_CARE_PROVIDER_SITE_OTHER): Payer: Self-pay | Admitting: Otolaryngology

## 2023-03-25 ENCOUNTER — Other Ambulatory Visit (HOSPITAL_COMMUNITY): Payer: Self-pay | Admitting: *Deleted

## 2023-03-25 ENCOUNTER — Telehealth (HOSPITAL_COMMUNITY): Payer: Self-pay | Admitting: *Deleted

## 2023-03-25 DIAGNOSIS — R059 Cough, unspecified: Secondary | ICD-10-CM

## 2023-03-25 DIAGNOSIS — R1310 Dysphagia, unspecified: Secondary | ICD-10-CM

## 2023-03-25 NOTE — Telephone Encounter (Signed)
Attempted to contact patient to schedule OP MBS. Left VM. RKEEL 

## 2023-03-26 ENCOUNTER — Other Ambulatory Visit (HOSPITAL_COMMUNITY): Payer: Self-pay

## 2023-04-07 ENCOUNTER — Ambulatory Visit (INDEPENDENT_AMBULATORY_CARE_PROVIDER_SITE_OTHER): Payer: Medicare Other | Admitting: Otolaryngology

## 2023-04-16 ENCOUNTER — Ambulatory Visit (HOSPITAL_COMMUNITY)
Admission: RE | Admit: 2023-04-16 | Discharge: 2023-04-16 | Disposition: A | Discharge status: Home / Self Care | Payer: Medicare Other | Source: Ambulatory Visit | Attending: Internal Medicine | Admitting: Internal Medicine

## 2023-04-16 ENCOUNTER — Other Ambulatory Visit (INDEPENDENT_AMBULATORY_CARE_PROVIDER_SITE_OTHER): Payer: Self-pay | Admitting: Otolaryngology

## 2023-04-16 ENCOUNTER — Ambulatory Visit (HOSPITAL_COMMUNITY)
Admission: RE | Admit: 2023-04-16 | Discharge: 2023-04-16 | Disposition: A | Discharge status: Home / Self Care | Payer: Medicare Other | Source: Ambulatory Visit | Attending: Otolaryngology | Admitting: Otolaryngology

## 2023-04-16 ENCOUNTER — Ambulatory Visit (HOSPITAL_COMMUNITY)
Admission: RE | Admit: 2023-04-16 | Discharge: 2023-04-16 | Disposition: A | Discharge status: Home / Self Care | Payer: Medicare Other | Source: Ambulatory Visit

## 2023-04-16 DIAGNOSIS — M5412 Radiculopathy, cervical region: Secondary | ICD-10-CM | POA: Insufficient documentation

## 2023-04-16 DIAGNOSIS — R0981 Nasal congestion: Secondary | ICD-10-CM

## 2023-04-16 DIAGNOSIS — R1310 Dysphagia, unspecified: Secondary | ICD-10-CM | POA: Insufficient documentation

## 2023-04-16 DIAGNOSIS — Z981 Arthrodesis status: Secondary | ICD-10-CM | POA: Insufficient documentation

## 2023-04-16 DIAGNOSIS — R059 Cough, unspecified: Secondary | ICD-10-CM

## 2023-04-16 DIAGNOSIS — J370 Chronic laryngitis: Secondary | ICD-10-CM

## 2023-04-16 DIAGNOSIS — K1120 Sialoadenitis, unspecified: Secondary | ICD-10-CM

## 2023-04-16 DIAGNOSIS — R682 Dry mouth, unspecified: Secondary | ICD-10-CM

## 2023-04-16 DIAGNOSIS — K219 Gastro-esophageal reflux disease without esophagitis: Secondary | ICD-10-CM

## 2023-04-16 DIAGNOSIS — K115 Sialolithiasis: Secondary | ICD-10-CM

## 2023-04-16 DIAGNOSIS — R09A2 Foreign body sensation, throat: Secondary | ICD-10-CM

## 2023-04-16 DIAGNOSIS — J302 Other seasonal allergic rhinitis: Secondary | ICD-10-CM

## 2023-04-18 ENCOUNTER — Ambulatory Visit (INDEPENDENT_AMBULATORY_CARE_PROVIDER_SITE_OTHER): Payer: Medicare Other | Admitting: Otolaryngology

## 2023-04-18 VITALS — BP 131/72 | HR 56

## 2023-04-18 DIAGNOSIS — R1310 Dysphagia, unspecified: Secondary | ICD-10-CM | POA: Diagnosis not present

## 2023-04-18 DIAGNOSIS — R0981 Nasal congestion: Secondary | ICD-10-CM

## 2023-04-18 DIAGNOSIS — R09A2 Foreign body sensation, throat: Secondary | ICD-10-CM | POA: Diagnosis not present

## 2023-04-18 DIAGNOSIS — J302 Other seasonal allergic rhinitis: Secondary | ICD-10-CM | POA: Diagnosis not present

## 2023-04-18 DIAGNOSIS — K219 Gastro-esophageal reflux disease without esophagitis: Secondary | ICD-10-CM

## 2023-04-18 DIAGNOSIS — J90 Pleural effusion, not elsewhere classified: Secondary | ICD-10-CM | POA: Diagnosis not present

## 2023-04-18 NOTE — Progress Notes (Signed)
ENT PROGRESS NOTE:  Update 04/18/23: He returns today to review swallow study results. Had MBS/esophagram. Feels that after initiation of medications last time, sx are much better.   Initial HPI:  Reason for Consult: globus sensation and throat discomfort/mucus    Referring Physician:  NSG, Dr Myer Haff   HPI: Alejandro Mendoza is an 67 y.o. male with hx ACDF left-sided approach, 11/04/22 C3-6 instrumentation, C4 corpectomy, C5-6 ACDF without complications, stayed overnight and went home the next day, here for evaluation of mild dysphagia and mucus in his throat.   He recalls that after anesthesia, he woke up with lots of mucus in his throat, and it improved overtime. He still has sensation of mucus in his throat at all times after the surgery. He denies significant swallowing issues and felt that he ate solid foods nearly right after the surgery, but he feels that swallowing is a bit more effortful. He denies solid food dysphagia. Denies coughing or choking on foods or fluids. At times pills are hard to swallow and it usually happens when his throat is dry. Denies hx of GERD/LPR, but was on reflux medication years ago for some time. He has seasonal allergies usually in Spring, and takes Benadryl PRN. On Flonase daily.  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 No voice or breathing issues.   Records Reviewed:  Susanne Borders, PA visit 01/23/2023 DOS: 11/04/22 C3-6 instrumentation, C4 corpectomy, C5-6 ACDF "3 months status post cervical fusion. Overall, he is doing well.  He continues to have some neck pain as well as numbness in his bilateral hands worse on the right than the left.  But he states that both of these things have improved since surgery.  He also has appreciated some difficulty with swallowing and feeling like a intermittent postnasal drip.  This is also improved since surgery but has not resolved."   12/17/22  Overall, he is doing well postoperatively.  His swallowing has gotten better.   His balance is much better than it was prior to surgery.  He is very happy with that.   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   Hyperlipidemia    Hypertension    Inflammatory arthritis    Neuropathy    Slipped disc in neck     Past Surgical History:  Procedure Laterality Date   ANTERIOR CERVICAL DECOMP/DISCECTOMY FUSION N/A 11/04/2022   Procedure: C3-6 INSTRUMENTATION, C4 CORPECTOMY, C5-6 ANTERIOR CERVICAL DISCECTOMY AND FUSION;  Surgeon: Venetia Night, MD;  Location: [facility];  Service: Neurosurgery;  Laterality: N/A;   COLONOSCOPY  2019   CONTINUOUS NERVE MONITORING N/A 06/10/2018   Procedure: FACIAL NERVE MONITORING;  Surgeon: Geanie Logan, MD;  Location: [facility];  Service: ENT;  Laterality: N/A;   CYST EXCISION  1969   FROM INSIDE IN JAW   EPIDURAL BLOCK INJECTION     SALIVARY STONE REMOVAL Left 06/10/2018   Procedure: SALIVARY STONE REMOVAL/SIALODUCOPLASTY;  Surgeon: Geanie Logan, MD;  Location: [facility];  Service: ENT;  Laterality: Left;   SUBMANDIBULAR GLAND EXCISION Left 06/10/2018   Procedure: EXCISION SUBMANDIBULAR GLAND;  Surgeon: Willeen Cass,  Renae Fickle, MD;  Location: [facility];  Service: ENT;  Laterality: Left;    No family history on file.  Social History:  reports that he quit smoking about 28 years ago. His smoking use included cigarettes. He started smoking about 43 years ago. He has a 7.5 pack-year smoking history. He has never used smokeless tobacco. He reports that he does not currently use alcohol. He reports that he does not use drugs.  Allergies:  Allergies  Allergen Reactions   Antihistamines, Chlorpheniramine-Type Other  (See Comments)    Allergies to antihistamines: caused Saliva stones   Aspartame Other (See Comments)    Other Reaction(s): Feeling faint  dizziness  Other Reaction(s): FAINTNESS   Lactose Other (See Comments)    GI UPSET   Lactose Intolerance (Gi) Other (See Comments)    GI UPSET   Methotrexate Other (See Comments)    Affected immune system   Nsaids Other (See Comments)    GI BLEED with Motrin   Other Other (See Comments)    Allergy Test: Grass and Mold- runny nose, sneezing, red eyes   Sucralose Other (See Comments)    dizziness   Sulfa Antibiotics     Light headed, dizziness   Sulfamethoxazole Other (See Comments)    Light headedness and dizziness   Zonisamide Other (See Comments)    Member doesn't remember reaction   Hydroxychloroquine Other (See Comments), Rash and Swelling    Fever/"burning up"  Other Reaction(s): Eruption of skin, Dizziness, Swelling, Eruption of skin, Dizziness, Swelling, Eruption of skin, Dizziness, Swelling   Hydroxychloroquine Sulfate Hives and Rash   Lisinopril Swelling, Hives and Rash    Facial swelling  Facial swelling  Lip swelling    Medications: I have reviewed the patient's current medications.   The PMH, PSH, Medications, Allergies, and SH were reviewed and updated.  ROS: Constitutional: Negative for fever, weight loss and weight gain. Cardiovascular: Negative for chest pain and dyspnea on exertion. Respiratory: Is not experiencing shortness of breath at rest. Gastrointestinal: Negative for nausea and vomiting. Neurological: Negative for headaches. Psychiatric: The patient is not nervous/anxiou  Blood pressure 131/72, pulse (!) 56, SpO2 100%.  PHYSICAL EXAM:  Exam: General: Well-developed, well-nourished Communication and Voice: low-pitch slightly raspy Respiratory Respiratory effort: Equal inspiration and expiration without stridor Cardiovascular Peripheral Vascular: Warm extremities with equal color/perfusion Eyes: No  nystagmus with equal extraocular motion bilaterally Neuro/Psych/Balance: Patient oriented to person, place, and time; Appropriate mood and affect; Gait is intact with no imbalance; Cranial nerves I-XII are intact  Head and Face Facial Strength: Facial motility symmetric and full bilaterally ENT Pinna: External ear intact and fully developed Neck Neck : no masses   Procedure done 03/24/2023 : Preoperative diagnosis: dysphagia, GERD, globus sensation   Postoperative diagnosis:   Same  Procedure: Flexible fiberoptic laryngoscopy  Surgeon: Ashok Croon, MD  Anesthesia: Topical lidocaine and Afrin Complications: None Condition is stable throughout exam  Indications and consent:  The patient presents to the clinic with Indirect laryngoscopy view was incomplete. Thus it was recommended that they undergo a flexible fiberoptic laryngoscopy. All of the risks, benefits, and potential complications were reviewed with the patient preoperatively and verbal informed consent was obtained.  Procedure: The patient was seated upright in the clinic. Topical lidocaine and Afrin were applied to the nasal cavity. After adequate anesthesia had occurred, I then proceeded to pass the flexible telescope into the nasal cavity. The nasal cavity was patent without rhinorrhea or polyp. The nasopharynx was also patent without mass or  lesion. The base of tongue was visualized and was normal. There were no signs of pooling of secretions in the piriform sinuses. The true vocal folds were mobile bilaterally. There were no signs of glottic or supraglottic mucosal lesion or mass. There was moderate interarytenoid pachydermia and post cricoid edema. The telescope was then slowly withdrawn and the patient tolerated the procedure throughout.  Studies Reviewed:2019 CT neck with contrast FINDINGS: PHARYNX AND LARYNX: Normal.  Widely patent airway.   SALIVARY GLANDS: 7 mm LEFT submandibular sialolith. Mild inflammatory changes  LEFT submental tubular duct with mild ductal dilatation, no mass. 5 mm round LEFT sublingual sialolith. Punctate LEFT parotid sialoliths.   THYROID: Normal.   LYMPH NODES: No lymphadenopathy by CT size criteria.   VASCULAR: Mild atherosclerosis carotid bifurcations.   LIMITED INTRACRANIAL: Normal.   VISUALIZED ORBITS: Normal.   MASTOIDS AND VISUALIZED PARANASAL SINUSES: LEFT maxillary mucosal retention cyst, associated LEFT maxillary periapical cyst.   SKELETON: Nonacute. Advanced degenerative changes cervical spine. Severe canal stenosis C3-4, moderate C4-5. Severe C3-4, C4-5 neural foraminal narrowing. Moderate to severe C5-6 neural foraminal narrowing.   UPPER CHEST: Lung apices are clear. Prominent though not pathologically enlarged superior mediastinal lymph nodes.   OTHER: Mildly thickened bilateral platysma. Mild LEFT neck effusion without focal fluid collection, subcutaneous gas or radiopaque foreign bodies.   IMPRESSION: 1. Acute LEFT submandibular sialoadenitis. 2. LEFT sublingual, LEFT submandibular and LEFT parotid sialoliths. 3. 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 Severe C3-4 and C4-5 neural foraminal narrowing.  MBS/esophagram results 04/16/23 Esophagram FINDINGS: Scout: There is blunting of left lateral costophrenic angle, which may be due pleural effusion or due to overlying soft tissues. Consider further evaluation with nonemergent AP and lateral chest radiographs.   No free air under the domes of diaphragm.   Lower cervical spinal fixation hardware is noted.   Double contrast views of the esophageal mucosa are normal-appearing.   Esophageal motility is normal.   There is no stricture, ring or web.   No hiatal hernia.   No spontaneous gastro-esophageal reflux and no reflux with the provocative "water siphon test", coughing or Valsalva maneuver.   IMPRESSION: 1. Blunting of the left lateral costophrenic angle may be due to pleural effusion or due to  overlying soft tissues. Consider further evaluation with nonemergent AP and lateral chest radiographs. 2. Esophageal motility is normal. No stricture, ring or web.   MBS:  Clinical Impression: Clinical Impression: Normal oropharyngeal swallow present.  Swallow was timely and strong without signficant retention. No aspiration or penetration.  Single episode of spillage of minimal amount of cracker to vallecular region prior to swallow which is [facility]. Epiglottic deflection was adequate.    Did not test tablet due to esophagram pending.   Pt reports his dysphagia, issues with mucus have resolved since his ENT visit.  SLP reviewed flouro loops with pt - to educate him to normal study.    Assessment/Plan: Encounter Diagnoses  Name Primary?   Pleural effusion Yes   Globus pharyngeus    Dysphagia, unspecified type    Seasonal allergies    Chronic nasal congestion    Gastroesophageal reflux disease without esophagitis     67 yoM hx of ACDF as above 10/2022 and prior hx of L SMG gland removal due to sialoadenitis and stones 2019, here for evaluation f mild dysphagia (mostly limited to pills) and sensation of mucus in his throat. Hx of seasonal allergies, takes Benadryl as needed, and on daily Flonase. Exam as above, with evidence of laryngitis sicca and  thick mucus and saliva throughout supraglottis and in oropharynx, but no lesions masses, and full b/l VF movement. No pooling of secretions in pyriforms. I suspect his sx are multifactorial and could be related to PND/seasonal allergies, dry mouth (medication side effect or under-hydration), vs inflammation and increased mucus production from GERD/LPR   - MBS/esophagram - continue Flonase and try to switch to Zyrtec - increase hydration  - alginates and diet/lifestyle changes to minimize reflux  - RTC in 2 weeks after swallow study  Update 04/18/23: His sx are better after starting Flonase/Zyrtec and alginates, and he reports better swallow overall. He  increased hydration and it helps. We discussed MBS/esophagram results, MBS with intact OP swallow, and esophagram without pathology. There was a concern for pleural effusion on esophagram, and we will obtain CXR 2-V to rule it out.   - will call with results of CXR  - if evidence of pleural effusion on CXR - I advised to see his PCP to discuss if there are abnormal findings on CXR    He started Flonase and Zyrtec. His sx are better.    Ashok Croon, MD Otolaryngology [facility] Phone: [phone] Fax: [phone]    04/18/2023, 11:16 AM

## 2023-04-28 ENCOUNTER — Inpatient Hospital Stay: Admit: 2023-04-28 | Payer: Medicare Other

## 2023-04-28 ENCOUNTER — Ambulatory Visit
Admission: RE | Admit: 2023-04-28 | Discharge: 2023-04-28 | Disposition: A | Discharge status: Home / Self Care | Payer: Medicare Other | Attending: Neurosurgery | Admitting: Neurosurgery

## 2023-04-28 ENCOUNTER — Ambulatory Visit
Admission: RE | Admit: 2023-04-28 | Discharge: 2023-04-28 | Disposition: A | Discharge status: Home / Self Care | Payer: Medicare Other | Source: Ambulatory Visit | Attending: Otolaryngology | Admitting: Otolaryngology

## 2023-04-28 ENCOUNTER — Ambulatory Visit
Admission: RE | Admit: 2023-04-28 | Discharge: 2023-04-28 | Disposition: A | Discharge status: Home / Self Care | Payer: Medicare Other | Attending: Otolaryngology | Admitting: Otolaryngology

## 2023-04-28 ENCOUNTER — Other Ambulatory Visit: Payer: Self-pay

## 2023-04-28 DIAGNOSIS — G959 Disease of spinal cord, unspecified: Secondary | ICD-10-CM

## 2023-04-28 DIAGNOSIS — J90 Pleural effusion, not elsewhere classified: Secondary | ICD-10-CM

## 2023-05-06 ENCOUNTER — Encounter: Payer: Self-pay | Admitting: Neurosurgery

## 2023-05-06 ENCOUNTER — Ambulatory Visit: Payer: Medicare Other | Admitting: Neurosurgery

## 2023-05-06 VITALS — BP 132/64 | Ht 75.0 in | Wt 258.0 lb

## 2023-05-06 DIAGNOSIS — G959 Disease of spinal cord, unspecified: Secondary | ICD-10-CM | POA: Diagnosis not present

## 2023-05-06 DIAGNOSIS — Z09 Encounter for follow-up examination after completed treatment for conditions other than malignant neoplasm: Secondary | ICD-10-CM | POA: Diagnosis not present

## 2023-05-06 MED ORDER — DICLOFENAC SODIUM 50 MG PO TBEC: 50.0000 mg | DELAYED_RELEASE_TABLET | Freq: Two times a day (BID) | ORAL | 0 refills | Status: DC

## 2023-05-06 NOTE — Progress Notes (Signed)
   REFERRING PHYSICIAN:  Gracelyn Nurse, Md [address] [place],  [place] [address]  DOS: 11/04/22 C3-6 instrumentation, C4 corpectomy, C5-6 ACDF  HISTORY OF PRESENT ILLNESS: Alejandro Mendoza is status post cervical fusion. Overall, he is doing well.    His strength is improved significantly since surgery.  He still having some discomfort in his neck at times.  He takes some muscle relaxants for this.   PHYSICAL EXAMINATION:  NEUROLOGICAL:  General: In no acute distress.   Awake, alert, oriented to person, place, and time.  Pupils equal round and reactive to light.  Facial tone is symmetric.  Tongue protrusion is midline.  There is no pronator drift.  Strength: Side Biceps Triceps Deltoid Interossei Grip Wrist Ext. Wrist Flex.  R 5 5 5 5 5 5 5   L 5 5 5 5 5 5 5    Incision c/d/I  Imaging:  01/23/23 cervical xrays Show intact hardware without evidence of hardware malfunction.  Cervical x-rays today are stable  Assessment / Plan: Alejandro Mendoza is doing well after cervical decompression and fusion.  I will prescribe some diclofenac to see if that will help with his discomfort.  I do not think that tramadol would be helpful at this time.  I will see him back in 6 months.  I am hopeful he will have continued improvement.    I spent a total of 10 minutes in this patient's care today. This time was spent reviewing pertinent records including imaging studies, obtaining and confirming history, performing a directed evaluation, formulating and discussing my recommendations, and documenting the visit within the medical record.    Venetia Night MD Dept of Neurosurgery

## 2023-06-17 ENCOUNTER — Other Ambulatory Visit: Payer: Self-pay | Admitting: Neurosurgery

## 2023-06-17 DIAGNOSIS — Z981 Arthrodesis status: Secondary | ICD-10-CM

## 2023-06-17 NOTE — Telephone Encounter (Signed)
Diclofenac sent to pharmacy

## 2023-06-17 NOTE — Telephone Encounter (Signed)
Please call him and see if he is still taking the voltaren. Has he had any GI side effects (nausea, vomiting, stomach pain, blood in stool)?

## 2023-07-14 ENCOUNTER — Other Ambulatory Visit: Payer: Self-pay | Admitting: Orthopedic Surgery

## 2023-07-14 DIAGNOSIS — Z981 Arthrodesis status: Secondary | ICD-10-CM

## 2023-07-14 NOTE — Telephone Encounter (Signed)
He has voltaren gel on his list as well- he should use this or the voltaren pills, not both at the same time.   Is he taking the voltaren pills everyday or prn? Any side effects? GI upset, nausea, vomiting, blood in stool?   Please let me know.

## 2023-07-14 NOTE — Telephone Encounter (Signed)
Patient states he doesn't want to take the Volteran pills right now. He states he will just use the gel as needed. He states he had blood work done by PCP and his kidney function is decreasing. He wants to take a break and see if just the gel will help as he needs it. He states he is not having side effects.

## 2023-07-14 NOTE — Telephone Encounter (Signed)
Voltaren sent back denied.

## 2023-07-14 NOTE — Telephone Encounter (Signed)
LMOM for patient to return call.

## 2023-07-21 ENCOUNTER — Encounter: Payer: Self-pay | Admitting: Neurosurgery

## 2023-07-21 DIAGNOSIS — Z981 Arthrodesis status: Secondary | ICD-10-CM

## 2023-07-21 MED ORDER — DICLOFENAC SODIUM 50 MG PO TBEC
50.0000 mg | DELAYED_RELEASE_TABLET | Freq: Two times a day (BID) | ORAL | 0 refills | Status: DC
Start: 2023-07-21 — End: 2023-08-15

## 2023-07-21 NOTE — Addendum Note (Signed)
Addended byDrake Leach on: 07/21/2023 01:55 PM   Modules accepted: Orders

## 2023-08-15 ENCOUNTER — Telehealth: Payer: Self-pay | Admitting: Orthopedic Surgery

## 2023-08-15 DIAGNOSIS — Z981 Arthrodesis status: Secondary | ICD-10-CM

## 2023-08-15 NOTE — Telephone Encounter (Signed)
Refill of voltaren sent to pharmacy.   Please remind him to stop with any stomach upset, nausea, or blood in stool.

## 2023-08-15 NOTE — Telephone Encounter (Signed)
Left voicemail to notify patient

## 2023-09-11 ENCOUNTER — Other Ambulatory Visit: Payer: Self-pay | Admitting: Orthopedic Surgery

## 2023-09-11 DIAGNOSIS — Z981 Arthrodesis status: Secondary | ICD-10-CM

## 2023-09-11 NOTE — Telephone Encounter (Signed)
 Refill of voltaren sent to pharmacy.   Please remind him to stop with any stomach upset, nausea, or blood in stool.

## 2023-10-10 ENCOUNTER — Telehealth: Payer: Self-pay | Admitting: Orthopedic Surgery

## 2023-10-10 DIAGNOSIS — Z981 Arthrodesis status: Secondary | ICD-10-CM

## 2023-10-10 NOTE — Telephone Encounter (Signed)
Patient is aware this is his last refill and to watch for any GI side effects.

## 2023-10-10 NOTE — Telephone Encounter (Signed)
Left message to call back  

## 2023-10-10 NOTE — Telephone Encounter (Signed)
Refill okay on diclofenac. Remind him to stop with any GI upset such as stomach pain, nausea, vomiting, or blood in his stool.   Please let him know he will need to get additional refills from his PCP.

## 2023-10-20 ENCOUNTER — Encounter (INDEPENDENT_AMBULATORY_CARE_PROVIDER_SITE_OTHER): Payer: Self-pay | Admitting: Otolaryngology

## 2023-10-20 ENCOUNTER — Ambulatory Visit (INDEPENDENT_AMBULATORY_CARE_PROVIDER_SITE_OTHER): Payer: Medicare Other | Admitting: Otolaryngology

## 2023-10-20 VITALS — BP 158/76 | HR 61

## 2023-10-20 DIAGNOSIS — K219 Gastro-esophageal reflux disease without esophagitis: Secondary | ICD-10-CM

## 2023-10-20 DIAGNOSIS — J3089 Other allergic rhinitis: Secondary | ICD-10-CM | POA: Diagnosis not present

## 2023-10-20 DIAGNOSIS — R0981 Nasal congestion: Secondary | ICD-10-CM | POA: Diagnosis not present

## 2023-10-20 DIAGNOSIS — R09A2 Foreign body sensation, throat: Secondary | ICD-10-CM

## 2023-10-20 NOTE — Progress Notes (Signed)
ENT PROGRESS NOTE:  Update 10/20/23 Discussed the use of AI scribe software for clinical note transcription with the patient, who gave verbal consent to proceed.  History of Present Illness   The patient, with a history of globus sensation, throat clearing and trouble with swallowing following ACDF procedure, here for f/u. He reports no new symptoms since the last visit. They have been managing their symptoms with Zyrtec, taken in the evenings, and occasional use of Flonase for nasal congestion. The patient noted a recent episode of worsening nasal congestion, which was relieved with the use of Flonase.  He has not yet started using Reflux Gourmet due to a lack of symptoms. They have expressed a desire to discontinue the Zyrtec and manage their symptoms with the nasal spray alone, citing a past issue with a salivary gland and salivary stones that they believe may have been caused by antihistamines. He had salivary gland removal 2/2 salivary stones.   The patient's allergy/nasal congestion issues appear to be seasonal, with symptoms typically starting in May. They have previously been offered allergy shots but declined due to the commitment involved.      Update 04/18/23: He returns today to review swallow study results. Had MBS/esophagram. Feels that after initiation of medications last time, sx are much better.   Initial HPI:  Reason for Consult: globus sensation and throat discomfort/mucus    Referring Physician:  [facility], Dr Myer Haff   HPI: Alejandro Mendoza is an 68 y.o. male with hx ACDF left-sided approach, 11/04/22 C3-6 instrumentation, C4 corpectomy, C5-6 ACDF without complications, stayed overnight and went home the next day, here for evaluation of mild dysphagia and mucus in his throat.   He recalls that after anesthesia, he woke up with lots of mucus in his throat, and it improved overtime. He still has sensation of mucus in his throat at all times after the surgery. He denies significant  swallowing issues and felt that he ate solid foods nearly right after the surgery, but he feels that swallowing is a bit more effortful. He denies solid food dysphagia. Denies coughing or choking on foods or fluids. At times pills are hard to swallow and it usually happens when his throat is dry. Denies hx of GERD/LPR, but was on reflux medication years ago for some time. He has seasonal allergies usually in Spring, and takes Benadryl PRN. On Flonase daily.  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 No voice or breathing issues.   Records Reviewed:  Susanne Borders, PA visit 01/23/2023 DOS: 11/04/22 C3-6 instrumentation, C4 corpectomy, C5-6 ACDF "3 months status post cervical fusion. Overall, he is doing well.  He continues to have some neck pain as well as numbness in his bilateral hands worse on the right than the left.  But he states that both of these things have improved since surgery.  He also has appreciated some difficulty with swallowing and feeling like a intermittent postnasal drip.  This is also improved since surgery but has not resolved."   12/17/22  Overall, he is doing well postoperatively.  His swallowing has gotten better.  His balance is much better than it was prior to surgery.  He is very happy with that.   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   Hyperlipidemia    Hypertension    Inflammatory arthritis    Neuropathy    Slipped disc in neck     Past Surgical History:  Procedure Laterality Date   ANTERIOR CERVICAL DECOMP/DISCECTOMY FUSION N/A 11/04/2022   Procedure: C3-6 INSTRUMENTATION, C4 CORPECTOMY, C5-6 ANTERIOR CERVICAL DISCECTOMY AND  FUSION;  Surgeon: Venetia Night, MD;  Location: [facility];  Service: Neurosurgery;  Laterality: N/A;   COLONOSCOPY  2019   CONTINUOUS NERVE MONITORING N/A 06/10/2018   Procedure: FACIAL NERVE MONITORING;  Surgeon: Geanie Logan, MD;  Location: [facility];  Service: ENT;  Laterality: N/A;   CYST EXCISION  1969   FROM INSIDE IN JAW   EPIDURAL BLOCK INJECTION     SALIVARY STONE REMOVAL Left 06/10/2018   Procedure: SALIVARY STONE REMOVAL/SIALODUCOPLASTY;  Surgeon: Geanie Logan, MD;  Location: [facility];  Service: ENT;  Laterality: Left;   SUBMANDIBULAR GLAND EXCISION Left 06/10/2018   Procedure: EXCISION SUBMANDIBULAR GLAND;  Surgeon: Geanie Logan, MD;  Location: [facility];  Service: ENT;  Laterality: Left;    History reviewed. No pertinent family history.  Social History:  reports that he quit smoking about 29 years ago. His smoking use included cigarettes. He started smoking about 44 years ago. He has a 7.5 pack-year smoking history. He has never used smokeless tobacco. He reports that he does not currently use alcohol. He reports that he does not use drugs.  Allergies:  Allergies  Allergen Reactions   Antihistamines, Chlorpheniramine-Type Other (See Comments)    Allergies to antihistamines: caused Saliva stones   Aspartame Other (See Comments)    Other Reaction(s): Feeling faint  dizziness  Other Reaction(s): FAINTNESS   Lactose Other (See Comments)    GI UPSET   Lactose Intolerance (Gi) Other (See Comments)    GI UPSET   Methotrexate Other (See Comments)    Affected immune system   Nsaids Other (See Comments)    GI BLEED with Motrin  GI BLEED    GI BLEED with Motrin   Other Other (See Comments)    Allergy Test: Grass and Mold- runny nose, sneezing, red eyes   Sucralose Other (See Comments)    dizziness   Sulfa Antibiotics     Light headed, dizziness   Sulfamethoxazole Other (See Comments)    Light headedness and dizziness   Zonisamide Other (See Comments)    Member  doesn't remember reaction   Hydroxychloroquine Other (See Comments), Rash and Swelling    Fever/"burning up"  Other Reaction(s): Eruption of skin, Dizziness, Swelling, Eruption of skin, Dizziness, Swelling, Eruption of skin, Dizziness, Swelling   Hydroxychloroquine Sulfate Hives and Rash   Lisinopril Swelling, Hives and Rash    Facial swelling  Facial swelling  Lip swelling    Medications: I have reviewed the patient's current medications.   The PMH, PSH, Medications, Allergies, and SH were reviewed and updated.  ROS: Constitutional: Negative for fever, weight loss and weight gain. Cardiovascular: Negative for chest pain and dyspnea on exertion. Respiratory: Is not experiencing shortness of breath at rest. Gastrointestinal: Negative for nausea and vomiting. Neurological: Negative for headaches. Psychiatric: The patient is not nervous/anxiou  Blood pressure (!) 158/76, pulse 61, SpO2 98%.  PHYSICAL EXAM:  Exam: General: Well-developed, well-nourished Respiratory Respiratory effort: Equal inspiration and expiration without stridor Cardiovascular Peripheral Vascular: Warm extremities with equal color/perfusion Eyes: No nystagmus with equal  extraocular motion bilaterally Neuro/Psych/Balance: Patient oriented to person, place, and time; Appropriate mood and affect; Gait is intact with no imbalance; Cranial nerves I-XII are intact  Head and Face Facial Strength: Facial motility symmetric and full bilaterally ENT Pinna: External ear intact and fully developed Neck Neck : no masses   Studies Reviewed:2019 CT neck with contrast FINDINGS: PHARYNX AND LARYNX: Normal.  Widely patent airway.   SALIVARY GLANDS: 7 mm LEFT submandibular sialolith. Mild inflammatory changes LEFT submental tubular duct with mild ductal dilatation, no mass. 5 mm round LEFT sublingual sialolith. Punctate LEFT parotid sialoliths.   THYROID: Normal.   LYMPH NODES: No lymphadenopathy by CT size  criteria.   VASCULAR: Mild atherosclerosis carotid bifurcations.   LIMITED INTRACRANIAL: Normal.   VISUALIZED ORBITS: Normal.   MASTOIDS AND VISUALIZED PARANASAL SINUSES: LEFT maxillary mucosal retention cyst, associated LEFT maxillary periapical cyst.   SKELETON: Nonacute. Advanced degenerative changes cervical spine. Severe canal stenosis C3-4, moderate C4-5. Severe C3-4, C4-5 neural foraminal narrowing. Moderate to severe C5-6 neural foraminal narrowing.   UPPER CHEST: Lung apices are clear. Prominent though not pathologically enlarged superior mediastinal lymph nodes.   OTHER: Mildly thickened bilateral platysma. Mild LEFT neck effusion without focal fluid collection, subcutaneous gas or radiopaque foreign bodies.   IMPRESSION: 1. Acute LEFT submandibular sialoadenitis. 2. LEFT sublingual, LEFT submandibular and LEFT parotid sialoliths. 3. 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 Severe C3-4 and C4-5 neural foraminal narrowing.  MBS/esophagram results 04/16/23 Esophagram FINDINGS: Scout: There is blunting of left lateral costophrenic angle, which may be due pleural effusion or due to overlying soft tissues. Consider further evaluation with nonemergent AP and lateral chest radiographs.   No free air under the domes of diaphragm.   Lower cervical spinal fixation hardware is noted.   Double contrast views of the esophageal mucosa are normal-appearing.   Esophageal motility is normal.   There is no stricture, ring or web.   No hiatal hernia.   No spontaneous gastro-esophageal reflux and no reflux with the provocative "water siphon test", coughing or Valsalva maneuver.   IMPRESSION: 1. Blunting of the left lateral costophrenic angle may be due to pleural effusion or due to overlying soft tissues. Consider further evaluation with nonemergent AP and lateral chest radiographs. 2. Esophageal motility is normal. No stricture, ring or web.   MBS:  Clinical Impression: Clinical  Impression: Normal oropharyngeal swallow present.  Swallow was timely and strong without signficant retention. No aspiration or penetration.  Single episode of spillage of minimal amount of cracker to vallecular region prior to swallow which is [facility]. Epiglottic deflection was adequate.    Did not test tablet due to esophagram pending.   Pt reports his dysphagia, issues with mucus have resolved since his ENT visit.  SLP reviewed flouro loops with pt - to educate him to normal study.    CXR 04/28/23 Normal - no acute process  Assessment/Plan: Encounter Diagnoses  Name Primary?   Chronic nasal congestion Yes   Globus pharyngeus    Chronic GERD    Environmental and seasonal allergies      67 yoM hx of ACDF as above 10/2022 and prior hx of L SMG gland removal due to sialoadenitis and stones 2019, here for evaluation f mild dysphagia (mostly limited to pills) and sensation of mucus in his throat. Hx of seasonal allergies, takes Benadryl as needed, and on daily Flonase. Exam as above, with evidence of laryngitis sicca and thick mucus and saliva throughout supraglottis and in oropharynx, but no lesions masses, and  full b/l VF movement. No pooling of secretions in pyriforms. I suspect his sx are multifactorial and could be related to PND/seasonal allergies, dry mouth (medication side effect or under-hydration), vs inflammation and increased mucus production from GERD/LPR   - MBS/esophagram - continue Flonase and try to switch to Zyrtec - increase hydration  - alginates and diet/lifestyle changes to minimize reflux  - RTC in 2 weeks after swallow study  Update 04/18/23: His sx are better after starting Flonase/Zyrtec and alginates, and he reports better swallow overall. He increased hydration and it helps. We discussed MBS/esophagram results, MBS with intact OP swallow, and esophagram without pathology. There was a concern for pleural effusion on esophagram, and we will obtain CXR 2-V to rule it out.   -  will call with results of CXR  - if evidence of pleural effusion on CXR - I advised to see his PCP to discuss if there are abnormal findings on CXR    He started Flonase and Zyrtec. His sx are better.   Update 10/20/23 Assessment and Plan    Chronic nasal congestion and environmental allergies Chronic allergic rhinitis/nasal congestion with seasonal exacerbations, primarily in May. Currently managed with Zyrtec (evening) and occasional Flonase nasal spray. Reports good symptom control with Zyrtec and prefers to minimize Flonase use. No new ENT symptoms. Discussed stopping Zyrtec to avoid systemic side effects and relying on Flonase.  Patient declined allergy shots in the past after allergy testing.  - Continue Zyrtec as needed, especially during seasonal exacerbations in May - Use Flonase nasal spray for nasal congestion - Consider stopping Zyrtec and using only Flonase if symptoms are controlled - Follow up as needed for any ENT-related issues  Dysphagia  Resolved - likely post-op after ACDF along with likely impact of GERD LPR Had normal MBS esophagram  - continue management of GERD LPR  Chronic GERD LPR  Diet and lifestyle changes to minimize reflux Consider reflux gourmet if sx return  Concern for pleural effusion on esophagram - CXR negative for any pulmonary pathology - we discussed results today    General Health Maintenance Patient declined allergy shots due to commitment required. - Discuss allergy shots as a future option if symptoms become unmanageable with current treatment  Follow-up - Schedule follow-up appointments as needed .        Ashok Croon, MD Otolaryngology [facility] Phone: [phone] Fax: [phone]    10/20/2023, 10:17 AM

## 2023-11-03 ENCOUNTER — Other Ambulatory Visit: Payer: Self-pay

## 2023-11-03 DIAGNOSIS — G959 Disease of spinal cord, unspecified: Secondary | ICD-10-CM

## 2023-11-04 ENCOUNTER — Ambulatory Visit (INDEPENDENT_AMBULATORY_CARE_PROVIDER_SITE_OTHER): Payer: Medicare Other | Admitting: Neurosurgery

## 2023-11-04 VITALS — BP 136/76 | Ht 75.0 in | Wt 258.0 lb

## 2023-11-04 DIAGNOSIS — Z981 Arthrodesis status: Secondary | ICD-10-CM

## 2023-11-04 DIAGNOSIS — G959 Disease of spinal cord, unspecified: Secondary | ICD-10-CM | POA: Diagnosis not present

## 2023-11-04 NOTE — Progress Notes (Signed)
   REFERRING PHYSICIAN:  Gracelyn Nurse, Md [address] [place],  [place] [address]  DOS: 11/04/22 C3-6 instrumentation, C4 corpectomy, C5-6 ACDF  HISTORY OF PRESENT ILLNESS: Alejandro Mendoza is status post cervical fusion. Overall, he is doing well.  He did suffer a fall a couple of months ago.  He had some numbness in his left hand after that but it is improving.  Overall he continues to be much better than he was prior to surgery.    PHYSICAL EXAMINATION:  NEUROLOGICAL:  General: In no acute distress.   Awake, alert, oriented to person, place, and time.  Pupils equal round and reactive to light.  Facial tone is symmetric.  Tongue protrusion is midline.  There is no pronator drift.  Strength: Side Biceps Triceps Deltoid Interossei Grip Wrist Ext. Wrist Flex.  R 5 5 5 5 5 5 5   L 5 5 5 5 5 5 5    Incision c/d/I  Imaging:  01/23/23 cervical xrays Show intact hardware without evidence of hardware malfunction.  Cervical x-rays 04/2023 are stable  Assessment / Plan: Alejandro Mendoza is doing well after cervical decompression and fusion.   I am glad that he is doing better than he was previously.  He has some mild residual symptoms of myelopathy.  I will be happy to see him back on an as-needed basis.  I spent a total of 10 minutes in this patient's care today. This time was spent reviewing pertinent records including imaging studies, obtaining and confirming history, performing a directed evaluation, formulating and discussing my recommendations, and documenting the visit within the medical record.    Venetia Night MD Dept of Neurosurgery

## 2023-11-10 ENCOUNTER — Telehealth: Payer: Self-pay | Admitting: Orthopedic Surgery

## 2023-11-10 DIAGNOSIS — Z981 Arthrodesis status: Secondary | ICD-10-CM

## 2023-11-10 NOTE — Telephone Encounter (Signed)
 Patient notified and expressed understanding.

## 2023-11-10 NOTE — Telephone Encounter (Signed)
 He was told last month to get further diclofenac refills from his PCP.   He is to f/u prn with [place].   Please let him know.

## 2023-11-10 NOTE — Telephone Encounter (Signed)
 Left message on voice mail  to call back

## 2024-02-27 ENCOUNTER — Other Ambulatory Visit (INDEPENDENT_AMBULATORY_CARE_PROVIDER_SITE_OTHER): Payer: Self-pay | Admitting: Otolaryngology
